# Patient Record
Sex: Female | Born: 1975 | Race: White | Hispanic: No | Smoking: Current every day smoker
Health system: Southern US, Community
[De-identification: ages and names within clinical notes are randomized; demographics above are authoritative.]

## PROBLEM LIST (undated history)

## (undated) DIAGNOSIS — D367 Benign neoplasm of other specified sites: Secondary | ICD-10-CM

## (undated) DIAGNOSIS — Z21 Asymptomatic human immunodeficiency virus [HIV] infection status: Secondary | ICD-10-CM

## (undated) DIAGNOSIS — L0390 Cellulitis, unspecified: Secondary | ICD-10-CM

## (undated) DIAGNOSIS — N926 Irregular menstruation, unspecified: Secondary | ICD-10-CM

## (undated) DIAGNOSIS — J40 Bronchitis, not specified as acute or chronic: Secondary | ICD-10-CM

## (undated) DIAGNOSIS — B20 Human immunodeficiency virus [HIV] disease: Secondary | ICD-10-CM

## (undated) DIAGNOSIS — G43909 Migraine, unspecified, not intractable, without status migrainosus: Secondary | ICD-10-CM

## (undated) DIAGNOSIS — D696 Thrombocytopenia, unspecified: Secondary | ICD-10-CM

## (undated) DIAGNOSIS — F329 Major depressive disorder, single episode, unspecified: Secondary | ICD-10-CM

## (undated) DIAGNOSIS — F32A Depression, unspecified: Secondary | ICD-10-CM

## (undated) DIAGNOSIS — D219 Benign neoplasm of connective and other soft tissue, unspecified: Secondary | ICD-10-CM

## (undated) HISTORY — DX: Bronchitis, not specified as acute or chronic: J40

## (undated) HISTORY — DX: Irregular menstruation, unspecified: N92.6

## (undated) HISTORY — DX: Migraine, unspecified, not intractable, without status migrainosus: G43.909

## (undated) HISTORY — DX: Cellulitis, unspecified: L03.90

## (undated) HISTORY — DX: Thrombocytopenia, unspecified: D69.6

## (undated) HISTORY — DX: Benign neoplasm of connective and other soft tissue, unspecified: D21.9

## (undated) HISTORY — PX: TUBAL LIGATION: SHX77

## (undated) HISTORY — DX: Human immunodeficiency virus (HIV) disease: B20

## (undated) HISTORY — DX: Benign neoplasm of other specified sites: D36.7

---

## 2011-07-18 ENCOUNTER — Emergency Department (HOSPITAL_COMMUNITY)
Admission: EM | Admit: 2011-07-18 | Discharge: 2011-07-19 | Disposition: A | Discharge status: Home / Self Care | Payer: Self-pay | Attending: Emergency Medicine | Admitting: Emergency Medicine

## 2011-07-18 ENCOUNTER — Emergency Department (HOSPITAL_COMMUNITY): Payer: Self-pay

## 2011-07-18 ENCOUNTER — Emergency Department (HOSPITAL_COMMUNITY)
Admission: EM | Admit: 2011-07-18 | Discharge: 2011-07-18 | Discharge status: Against Medical Advice | Payer: Self-pay | Attending: Emergency Medicine | Admitting: Emergency Medicine

## 2011-07-18 DIAGNOSIS — Z79899 Other long term (current) drug therapy: Secondary | ICD-10-CM | POA: Insufficient documentation

## 2011-07-18 DIAGNOSIS — J329 Chronic sinusitis, unspecified: Secondary | ICD-10-CM | POA: Insufficient documentation

## 2011-07-18 DIAGNOSIS — R079 Chest pain, unspecified: Secondary | ICD-10-CM | POA: Insufficient documentation

## 2011-07-18 DIAGNOSIS — R05 Cough: Secondary | ICD-10-CM | POA: Insufficient documentation

## 2011-07-18 DIAGNOSIS — J3489 Other specified disorders of nose and nasal sinuses: Secondary | ICD-10-CM | POA: Insufficient documentation

## 2011-07-18 DIAGNOSIS — R059 Cough, unspecified: Secondary | ICD-10-CM | POA: Insufficient documentation

## 2011-07-18 DIAGNOSIS — B20 Human immunodeficiency virus [HIV] disease: Secondary | ICD-10-CM | POA: Insufficient documentation

## 2011-07-18 DIAGNOSIS — Z0389 Encounter for observation for other suspected diseases and conditions ruled out: Secondary | ICD-10-CM | POA: Insufficient documentation

## 2011-07-18 DIAGNOSIS — J069 Acute upper respiratory infection, unspecified: Secondary | ICD-10-CM | POA: Insufficient documentation

## 2011-07-18 LAB — DIFFERENTIAL
Basophils Absolute: 0 10*3/uL (ref 0.0–0.1)
Lymphocytes Relative: 35 % (ref 12–46)
Neutro Abs: 3 10*3/uL (ref 1.7–7.7)
Neutrophils Relative %: 54 % (ref 43–77)

## 2011-07-18 LAB — LACTATE DEHYDROGENASE: LDH: 196 U/L (ref 94–250)

## 2011-07-18 LAB — COMPREHENSIVE METABOLIC PANEL
ALT: 19 U/L (ref 0–35)
Albumin: 4.4 g/dL (ref 3.5–5.2)
Alkaline Phosphatase: 54 U/L (ref 39–117)
BUN: 15 mg/dL (ref 6–23)
Chloride: 106 mEq/L (ref 96–112)
Glucose, Bld: 79 mg/dL (ref 70–99)
Potassium: 3.9 mEq/L (ref 3.5–5.1)
Total Bilirubin: 0.4 mg/dL (ref 0.3–1.2)

## 2011-07-18 LAB — CBC
HCT: 45.3 % (ref 36.0–46.0)
Hemoglobin: 15.5 g/dL — ABNORMAL HIGH (ref 12.0–15.0)
WBC: 5.6 10*3/uL (ref 4.0–10.5)

## 2011-09-07 ENCOUNTER — Ambulatory Visit: Payer: Self-pay

## 2011-09-13 ENCOUNTER — Telehealth: Payer: Self-pay

## 2011-09-13 DIAGNOSIS — B20 Human immunodeficiency virus [HIV] disease: Secondary | ICD-10-CM

## 2011-09-13 NOTE — Telephone Encounter (Signed)
Multiple attempts were made to reach patient via phone. She cancelled the scheduled intake appointment the day prior and then called back demanding to be seen ASAP.  She left a message on the voice mail for the receptionist stating she was not happy with the treatment or lack of care.  She requested a call back on Friday and if the call was not returned she was going to report our office to the superiors at the hospital.   She says her life is at stake and we don't seem to care.   Pt was informed she would need intake which she refused since she has been diagnosed with "HIV for years".  She is out of medication as has several health issues that must be addressed soon. She was advised the visit for labs will only be labs and she will not see the physician for 2 weeks . If she has an urgent need that needs attention she can go to the ED or the urgent care.  Pt was very rude and interrupted any comment I started.  She is a [facility] referral. Amy had problems obtaining the medical records which resulted in the delay of referral for the intake.   Labs will be ordered w/o intake and appointment with provider will be scheduled to accommodate the patient.  Laurell Josephs, RN   Appointment scheduled with Dr Ninetta Lights 09-21-16-12.   Laurell Josephs, RN

## 2011-09-14 ENCOUNTER — Other Ambulatory Visit: Payer: Self-pay

## 2011-09-15 ENCOUNTER — Other Ambulatory Visit: Payer: Self-pay | Admitting: Infectious Diseases

## 2011-09-15 ENCOUNTER — Telehealth: Payer: Self-pay

## 2011-09-15 ENCOUNTER — Ambulatory Visit: Payer: Self-pay

## 2011-09-15 ENCOUNTER — Other Ambulatory Visit: Payer: Self-pay

## 2011-09-15 DIAGNOSIS — B20 Human immunodeficiency virus [HIV] disease: Secondary | ICD-10-CM

## 2011-09-15 DIAGNOSIS — Z79899 Other long term (current) drug therapy: Secondary | ICD-10-CM | POA: Insufficient documentation

## 2011-09-15 DIAGNOSIS — Z113 Encounter for screening for infections with a predominantly sexual mode of transmission: Secondary | ICD-10-CM

## 2011-09-15 LAB — COMPLETE METABOLIC PANEL WITH GFR
ALT: 16 U/L (ref 0–35)
AST: 20 U/L (ref 0–37)
Albumin: 4.6 g/dL (ref 3.5–5.2)
Alkaline Phosphatase: 50 U/L (ref 39–117)
Calcium: 9.3 mg/dL (ref 8.4–10.5)
Chloride: 108 mEq/L (ref 96–112)
Potassium: 4.1 mEq/L (ref 3.5–5.3)

## 2011-09-15 LAB — CBC WITH DIFFERENTIAL/PLATELET
Basophils Absolute: 0 10*3/uL (ref 0.0–0.1)
Basophils Relative: 1 % (ref 0–1)
Eosinophils Absolute: 0.2 10*3/uL (ref 0.0–0.7)
Eosinophils Relative: 5 % (ref 0–5)
HCT: 47.1 % — ABNORMAL HIGH (ref 36.0–46.0)
MCH: 30.3 pg (ref 26.0–34.0)
MCHC: 33.5 g/dL (ref 30.0–36.0)
Monocytes Absolute: 0.2 10*3/uL (ref 0.1–1.0)
Neutro Abs: 2.4 10*3/uL (ref 1.7–7.7)
RDW: 14 % (ref 11.5–15.5)

## 2011-09-15 LAB — URINALYSIS, MICROSCOPIC ONLY

## 2011-09-15 LAB — URINALYSIS, ROUTINE W REFLEX MICROSCOPIC
Bilirubin Urine: NEGATIVE
Glucose, UA: NEGATIVE mg/dL
Ketones, ur: NEGATIVE mg/dL
Protein, ur: NEGATIVE mg/dL
Urobilinogen, UA: 0.2 mg/dL (ref 0.0–1.0)

## 2011-09-15 LAB — LIPID PANEL
Cholesterol: 196 mg/dL (ref 0–200)
HDL: 52 mg/dL (ref >39)
LDL Cholesterol: 120 mg/dL — ABNORMAL HIGH (ref 0–99)
Triglycerides: 118 mg/dL (ref <150)

## 2011-09-15 LAB — HEPATITIS C ANTIBODY: HCV Ab: NEGATIVE

## 2011-09-15 LAB — HEPATITIS B SURFACE ANTIGEN: Hepatitis B Surface Ag: NEGATIVE

## 2011-09-15 LAB — HIV ANTIBODY (ROUTINE TESTING W REFLEX): HIV: REACTIVE

## 2011-09-15 LAB — HEPATITIS B SURFACE ANTIBODY,QUALITATIVE: Hep B S Ab: NEGATIVE

## 2011-09-15 NOTE — Telephone Encounter (Signed)
Patient met with Asher Muir and me to complete ADAP application. Had received documentation from Amy Faw, but patient only provided bank stmts as income documentation and ADAP no longer accepts that. Advised there was chance they would pend her application. She and her 2 sons receive SSD benefits - advised her to bring letters and more current utility bill. She said she had at home and would bring back today. So far, she has not come in or called.

## 2011-09-16 LAB — T-HELPER CELL (CD4) - (RCID CLINIC ONLY)
CD4 % Helper T Cell: 40 % (ref 33–55)
CD4 T Cell Abs: 430 uL (ref 400–2700)

## 2011-09-16 LAB — HEPATITIS A ANTIBODY, TOTAL: Hep A Total Ab: POSITIVE — AB

## 2011-09-17 ENCOUNTER — Telehealth: Payer: Self-pay

## 2011-09-17 NOTE — Telephone Encounter (Signed)
Called patient today as she had not come in yet to bring her SSI letters to complete her ADAP application. She stated she would bring in today before we closed at 5PM. Stated she had to take son to Dr etc, and that is why she wasn't able to come before now.

## 2011-09-19 ENCOUNTER — Emergency Department (HOSPITAL_COMMUNITY)
Admission: EM | Admit: 2011-09-19 | Discharge: 2011-09-19 | Disposition: A | Discharge status: Home / Self Care | Payer: Self-pay | Attending: Emergency Medicine | Admitting: Emergency Medicine

## 2011-09-19 ENCOUNTER — Emergency Department (HOSPITAL_COMMUNITY): Payer: Self-pay

## 2011-09-19 ENCOUNTER — Encounter: Payer: Self-pay | Admitting: *Deleted

## 2011-09-19 DIAGNOSIS — B9689 Other specified bacterial agents as the cause of diseases classified elsewhere: Secondary | ICD-10-CM | POA: Insufficient documentation

## 2011-09-19 DIAGNOSIS — A499 Bacterial infection, unspecified: Secondary | ICD-10-CM | POA: Insufficient documentation

## 2011-09-19 DIAGNOSIS — D250 Submucous leiomyoma of uterus: Secondary | ICD-10-CM | POA: Insufficient documentation

## 2011-09-19 DIAGNOSIS — N760 Acute vaginitis: Secondary | ICD-10-CM | POA: Insufficient documentation

## 2011-09-19 DIAGNOSIS — R109 Unspecified abdominal pain: Secondary | ICD-10-CM | POA: Insufficient documentation

## 2011-09-19 HISTORY — DX: Human immunodeficiency virus (HIV) disease: B20

## 2011-09-19 HISTORY — DX: Asymptomatic human immunodeficiency virus (hiv) infection status: Z21

## 2011-09-19 LAB — URINALYSIS, ROUTINE W REFLEX MICROSCOPIC
Bilirubin Urine: NEGATIVE
Glucose, UA: NEGATIVE mg/dL
Ketones, ur: NEGATIVE mg/dL
Specific Gravity, Urine: 1.026 (ref 1.005–1.030)
pH: 7 (ref 5.0–8.0)

## 2011-09-19 LAB — WET PREP, GENITAL
Trich, Wet Prep: NONE SEEN
Yeast Wet Prep HPF POC: NONE SEEN

## 2011-09-19 MED ORDER — METRONIDAZOLE 500 MG PO TABS: 500.0000 mg | ORAL_TABLET | Freq: Two times a day (BID) | ORAL | Status: DC

## 2011-09-19 MED ORDER — HYDROMORPHONE HCL PF 1 MG/ML IJ SOLN
1.0000 mg | Freq: Once | INTRAMUSCULAR | Status: AC
  Administered 2011-09-19: 1 mg via INTRAMUSCULAR
  Filled 2011-09-19: qty 1

## 2011-09-19 MED ORDER — OXYCODONE-ACETAMINOPHEN 5-325 MG PO TABS
2.0000 | ORAL_TABLET | Freq: Once | ORAL | Status: AC
  Administered 2011-09-19: 2 via ORAL
  Filled 2011-09-19: qty 2

## 2011-09-19 NOTE — ED Notes (Signed)
Pt reports irregular periods x 6 months, right lower abd pain, vaginal discharge intermittent, urinary incontinence, denies pain with urination.

## 2011-09-19 NOTE — ED Provider Notes (Signed)
  Physical Exam  BP 122/78  Pulse 92  Temp(Src) 102.1 F (38.9 C) (Oral)  Resp 20  SpO2 98%  LMP 09/07/2011  Physical Exam  ED Course  Procedures  MDM Pt seen earlier today by another provider. Pt called ED requesting prescription for pain medication. Per review of narcotic database, pt was dispensed #75 percocet on 09/03/11 and has one refill of the same. No new additional prescriptions for pain medication were given.      Raeford Razor, MD 09/19/11 [phone]

## 2011-09-19 NOTE — ED Notes (Signed)
Pt to the restroom for urine sample.

## 2011-09-20 ENCOUNTER — Telehealth: Payer: Self-pay | Admitting: *Deleted

## 2011-09-20 DIAGNOSIS — B20 Human immunodeficiency virus [HIV] disease: Secondary | ICD-10-CM

## 2011-09-20 LAB — HIV-1 RNA ULTRAQUANT REFLEX TO GENTYP+
HIV 1 RNA Quant: <20 copies/mL (ref <20)
HIV-1 RNA Quant, Log: <1.3 {Log} (ref <1.30)

## 2011-09-20 MED ORDER — RALTEGRAVIR POTASSIUM 400 MG PO TABS: 400.0000 mg | ORAL_TABLET | Freq: Two times a day (BID) | ORAL | Status: DC

## 2011-09-20 MED ORDER — EMTRICITABINE-TENOFOVIR DF 200-300 MG PO TABS: 1.0000 | ORAL_TABLET | Freq: Every day | ORAL | Status: DC

## 2011-09-20 NOTE — Telephone Encounter (Signed)
Pt concerned about f/u from ED visit.  Was told to contact the [facility] Health Dept so that her partner could be treated if necessary for genital bacterial infection.  She was also told "she would need a hysterectomy d/t uterine fibroids" and should make an appt at [facility].  RN reviewed the ED notes for information.  Pt was diagnosed with BV and rx sent to pharmacy for treatment.  Pt did have a uterine ultrasound and showed a 1.9cm anterior fibroid.  The pt stated that she had been told at her previous HIV clinic in [place], [place] that she should schedule an ultrasound but had not had the time prior to moving to [place].  RN advised that the fibroid found on the ultrasound was less than an inch in diameter and was described as "small anterior fibroid."  RN apologized that the pt was told that she "would need a hysterectomy" for this issue.  RN advised that the pt should talk with Dr. Daiva Eves about these issues at her first visit to [facility] on  09/27/11 @ 1130.  Pt verbalized understanding of this advise.

## 2011-09-27 ENCOUNTER — Ambulatory Visit (HOSPITAL_COMMUNITY): Payer: Self-pay

## 2011-09-27 ENCOUNTER — Ambulatory Visit: Payer: Self-pay

## 2011-09-27 ENCOUNTER — Ambulatory Visit (INDEPENDENT_AMBULATORY_CARE_PROVIDER_SITE_OTHER): Payer: Self-pay | Admitting: Infectious Disease

## 2011-09-27 ENCOUNTER — Other Ambulatory Visit (HOSPITAL_COMMUNITY): Payer: Self-pay | Admitting: *Deleted

## 2011-09-27 ENCOUNTER — Encounter: Payer: Self-pay | Admitting: Infectious Disease

## 2011-09-27 VITALS — BP 123/81 | HR 96 | Temp 98.1°F | Ht 61.0 in | Wt 111.2 lb

## 2011-09-27 DIAGNOSIS — G43909 Migraine, unspecified, not intractable, without status migrainosus: Secondary | ICD-10-CM

## 2011-09-27 DIAGNOSIS — D367 Benign neoplasm of other specified sites: Secondary | ICD-10-CM

## 2011-09-27 DIAGNOSIS — Z23 Encounter for immunization: Secondary | ICD-10-CM

## 2011-09-27 DIAGNOSIS — B20 Human immunodeficiency virus [HIV] disease: Secondary | ICD-10-CM

## 2011-09-27 DIAGNOSIS — J45909 Unspecified asthma, uncomplicated: Secondary | ICD-10-CM | POA: Insufficient documentation

## 2011-09-27 DIAGNOSIS — N926 Irregular menstruation, unspecified: Secondary | ICD-10-CM | POA: Insufficient documentation

## 2011-09-27 DIAGNOSIS — D2360 Other benign neoplasm of skin of unspecified upper limb, including shoulder: Secondary | ICD-10-CM

## 2011-09-27 DIAGNOSIS — F329 Major depressive disorder, single episode, unspecified: Secondary | ICD-10-CM | POA: Insufficient documentation

## 2011-09-27 DIAGNOSIS — F4310 Post-traumatic stress disorder, unspecified: Secondary | ICD-10-CM | POA: Insufficient documentation

## 2011-09-27 DIAGNOSIS — J40 Bronchitis, not specified as acute or chronic: Secondary | ICD-10-CM

## 2011-09-27 MED ORDER — DOXYCYCLINE HYCLATE 100 MG PO TABS: 100.0000 mg | ORAL_TABLET | Freq: Two times a day (BID) | ORAL | Status: AC

## 2011-09-27 NOTE — Assessment & Plan Note (Signed)
We'll check a Doppler to ensure there is no DVT although suspicion is not terribly high. We'll give her doxycycline for her bronchitis also potentially cover a cellulitis in this area.

## 2011-09-27 NOTE — Assessment & Plan Note (Signed)
Continue isentress and truvada. Influenza vaccine today. Bring back in 2 months time.

## 2011-09-27 NOTE — Assessment & Plan Note (Signed)
Continue Celexa refer to psychiatry

## 2011-09-27 NOTE — Assessment & Plan Note (Signed)
Is a 14 day course of doxycycline. Continue inhalers.
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
## 2011-09-27 NOTE — Assessment & Plan Note (Signed)
Continue Topamax and Imitrex for breakthrough migraines.

## 2011-09-27 NOTE — Assessment & Plan Note (Signed)
Refer to psychiatry. 

## 2011-09-28 DIAGNOSIS — Z23 Encounter for immunization: Secondary | ICD-10-CM

## 2011-10-01 ENCOUNTER — Ambulatory Visit: Payer: Self-pay | Admitting: Infectious Diseases

## 2011-11-22 ENCOUNTER — Other Ambulatory Visit: Payer: Self-pay | Admitting: Licensed Clinical Social Worker

## 2011-11-22 ENCOUNTER — Other Ambulatory Visit: Payer: Self-pay | Admitting: *Deleted

## 2011-11-22 DIAGNOSIS — B20 Human immunodeficiency virus [HIV] disease: Secondary | ICD-10-CM

## 2011-11-22 MED ORDER — RALTEGRAVIR POTASSIUM 400 MG PO TABS: 400.0000 mg | ORAL_TABLET | Freq: Two times a day (BID) | ORAL | Status: DC

## 2011-11-22 MED ORDER — EMTRICITABINE-TENOFOVIR DF 200-300 MG PO TABS: 1.0000 | ORAL_TABLET | Freq: Every day | ORAL | Status: DC

## 2011-11-22 NOTE — Telephone Encounter (Signed)
Rxes already refill.

## 2011-11-24 ENCOUNTER — Other Ambulatory Visit (INDEPENDENT_AMBULATORY_CARE_PROVIDER_SITE_OTHER): Payer: Self-pay

## 2011-11-24 ENCOUNTER — Telehealth: Payer: Self-pay | Admitting: *Deleted

## 2011-11-24 ENCOUNTER — Other Ambulatory Visit: Payer: Self-pay | Admitting: Infectious Disease

## 2011-11-24 DIAGNOSIS — Z113 Encounter for screening for infections with a predominantly sexual mode of transmission: Secondary | ICD-10-CM

## 2011-11-24 DIAGNOSIS — Z79899 Other long term (current) drug therapy: Secondary | ICD-10-CM

## 2011-11-24 DIAGNOSIS — B20 Human immunodeficiency virus [HIV] disease: Secondary | ICD-10-CM

## 2011-11-24 NOTE — Telephone Encounter (Signed)
Having asthma symptoms of SOB/wheezing.  Previous rx from [facility] has run out of refills.  Requesting refill for Albuterol.  Please advise.

## 2011-11-24 NOTE — Telephone Encounter (Signed)
Fine togive but she should also get seen if she is wheezing badly. May need steroid pack

## 2011-11-25 LAB — CBC WITH DIFFERENTIAL/PLATELET
Basophils Absolute: 0 10*3/uL (ref 0.0–0.1)
Eosinophils Relative: 7 % — ABNORMAL HIGH (ref 0–5)
HCT: 46.5 % — ABNORMAL HIGH (ref 36.0–46.0)
Lymphocytes Relative: 36 % (ref 12–46)
Lymphs Abs: 1.3 10*3/uL (ref 0.7–4.0)
MCV: 91.9 fL (ref 78.0–100.0)
Monocytes Absolute: 0.2 10*3/uL (ref 0.1–1.0)
Neutro Abs: 1.9 10*3/uL (ref 1.7–7.7)
Platelets: 215 10*3/uL (ref 150–400)
RBC: 5.06 MIL/uL (ref 3.87–5.11)
WBC: 3.7 10*3/uL — ABNORMAL LOW (ref 4.0–10.5)

## 2011-11-25 LAB — LIPID PANEL
HDL: 45 mg/dL (ref >39)
Total CHOL/HDL Ratio: 3.8 Ratio
Triglycerides: 182 mg/dL — ABNORMAL HIGH (ref <150)

## 2011-11-25 LAB — GC/CHLAMYDIA PROBE AMP, URINE: Chlamydia, Swab/Urine, PCR: NEGATIVE

## 2011-11-25 LAB — T-HELPER CELL (CD4) - (RCID CLINIC ONLY): CD4 % Helper T Cell: 39 % (ref 33–55)

## 2011-11-26 LAB — HIV-1 RNA QUANT-NO REFLEX-BLD: HIV-1 RNA Quant, Log: <1.3 {Log} (ref <1.30)

## 2011-11-29 ENCOUNTER — Other Ambulatory Visit: Payer: Self-pay | Admitting: Infectious Disease

## 2011-11-29 MED ORDER — ALBUTEROL SULFATE HFA 108 (90 BASE) MCG/ACT IN AERS: 2.0000 | INHALATION_SPRAY | Freq: Four times a day (QID) | RESPIRATORY_TRACT | Status: DC | PRN

## 2011-11-29 NOTE — Telephone Encounter (Signed)
FINE TO GIVE THIS TO HIM. HE MAY NEED TO BE SEEN BY SOMEONE

## 2011-12-01 ENCOUNTER — Other Ambulatory Visit: Payer: Self-pay | Admitting: *Deleted

## 2011-12-01 MED ORDER — ALBUTEROL SULFATE HFA 108 (90 BASE) MCG/ACT IN AERS: 2.0000 | INHALATION_SPRAY | Freq: Four times a day (QID) | RESPIRATORY_TRACT | Status: AC | PRN

## 2011-12-01 NOTE — Telephone Encounter (Signed)
Spoke with pt.  She found her prior rx number and called for a refill.  Breathing has improved.  New rx will be called to [facility] on [address] for future use.

## 2011-12-08 ENCOUNTER — Ambulatory Visit (INDEPENDENT_AMBULATORY_CARE_PROVIDER_SITE_OTHER): Payer: Self-pay | Admitting: Infectious Disease

## 2011-12-08 ENCOUNTER — Encounter: Payer: Self-pay | Admitting: Infectious Disease

## 2011-12-08 ENCOUNTER — Ambulatory Visit: Payer: Self-pay

## 2011-12-08 VITALS — BP 104/70 | HR 71 | Temp 98.0°F | Wt 117.0 lb

## 2011-12-08 DIAGNOSIS — F329 Major depressive disorder, single episode, unspecified: Secondary | ICD-10-CM

## 2011-12-08 DIAGNOSIS — Z21 Asymptomatic human immunodeficiency virus [HIV] infection status: Secondary | ICD-10-CM

## 2011-12-08 DIAGNOSIS — B20 Human immunodeficiency virus [HIV] disease: Secondary | ICD-10-CM | POA: Insufficient documentation

## 2011-12-08 DIAGNOSIS — F4310 Post-traumatic stress disorder, unspecified: Secondary | ICD-10-CM

## 2011-12-08 MED ORDER — CITALOPRAM HYDROBROMIDE 20 MG PO TABS: 20.0000 mg | ORAL_TABLET | Freq: Every day | ORAL | Status: DC

## 2011-12-08 MED ORDER — CLONAZEPAM 0.5 MG PO TABS: 0.5000 mg | ORAL_TABLET | Freq: Every day | ORAL | Status: DC

## 2011-12-08 NOTE — Assessment & Plan Note (Signed)
Restart celexa and klonopin and take every day. Plugged into [facility] for Counselling and CBT

## 2011-12-08 NOTE — Progress Notes (Signed)
Subjective:    Patient ID: Cassandra Rogers, female    DOB: 1976-10-17, 36 y.o.   MRN: [id number]  HPI 36 year old Caucasian lady with HIV diagnosed at the age of 16 years, managed previously in [facility]. She moved to [place] end of July/August. She has been on isentress and truvada with excellent virological control. She presents today to [facility] for followup. Her HIV remains perfectly controlled. She  Is having less migraines and is off of the topomax now. Her depression and PTSD however are not doing well. She is being seen by [facility] but she is off ssri and benzos and having panic attacks. At times she is having passive suicidal ideation but not active suicidal ideation and she is contracted for safety. I spent greater than 45 minutes with the patient including greater than 50% of time in face to face counsel of the patient and in coordination of their care.   [facility] is giving her CBT and Biofeedback.   Review of Systems  Constitutional: Negative for fever, chills, diaphoresis, activity change, appetite change, fatigue and unexpected weight change.  HENT: Negative for congestion, sore throat, rhinorrhea, sneezing, trouble swallowing and sinus pressure.   Eyes: Negative for photophobia and visual disturbance.  Respiratory: Negative for cough, chest tightness, shortness of breath, wheezing and stridor.   Cardiovascular: Negative for chest pain, palpitations and leg swelling.  Gastrointestinal: Negative for nausea, vomiting, abdominal pain, diarrhea, constipation, blood in stool, abdominal distention and anal bleeding.  Genitourinary: Negative for dysuria, hematuria, flank pain and difficulty urinating.  Musculoskeletal: Negative for myalgias, back pain, joint swelling, arthralgias and gait problem.  Skin: Negative for color change, pallor, rash and wound.  Neurological: Negative for dizziness, tremors, weakness and light-headedness.  Hematological: Negative  for adenopathy. Does not bruise/bleed easily.  Psychiatric/Behavioral: Positive for suicidal ideas, dysphoric mood and decreased concentration. Negative for behavioral problems, confusion, sleep disturbance and agitation. The patient is nervous/anxious.        Objective:   Physical Exam  Constitutional: She is oriented to person, place, and time. She appears well-developed and well-nourished. No distress.  HENT:  Head: Normocephalic and atraumatic.  Mouth/Throat: Oropharynx is clear and moist. No oropharyngeal exudate.  Eyes: Conjunctivae and EOM are normal. Pupils are equal, round, and reactive to light. No scleral icterus.  Neck: Normal range of motion. Neck supple. No JVD present.  Cardiovascular: Normal rate, regular rhythm and normal heart sounds.  Exam reveals no gallop and no friction rub.   No murmur heard. Pulmonary/Chest: Effort normal and breath sounds normal. No respiratory distress. She has no wheezes. She has no rales. She exhibits no tenderness.  Abdominal: She exhibits no distension and no mass. There is no tenderness. There is no rebound and no guarding.  Musculoskeletal: She exhibits no edema and no tenderness.  Lymphadenopathy:    She has no cervical adenopathy.  Neurological: She is alert and oriented to person, place, and time. She has normal reflexes. She exhibits normal muscle tone. Coordination normal.  Skin: Skin is warm and dry. She is not diaphoretic. No erythema. No pallor.  Psychiatric: Her behavior is normal. Judgment and thought content normal. Her mood appears anxious. She exhibits a depressed mood.          Assessment & Plan:  HIV (human immunodeficiency virus infection) Perfect suppression  PTSD (post-traumatic stress disorder) Restart celexa and klonopin and take every day. Plugged into [facility] for Counselling and CBT  Depression See above discussion

## 2011-12-08 NOTE — Assessment & Plan Note (Signed)
Perfect suppression 

## 2011-12-08 NOTE — Patient Instructions (Signed)
I would like you to meet with [facility] case manager today and refer you to psychiatry to complement the other care you are receiving

## 2011-12-08 NOTE — Assessment & Plan Note (Signed)
See above discussion

## 2012-01-25 ENCOUNTER — Encounter (HOSPITAL_COMMUNITY): Payer: Self-pay | Admitting: Emergency Medicine

## 2012-01-25 ENCOUNTER — Emergency Department (HOSPITAL_COMMUNITY)
Admission: EM | Admit: 2012-01-25 | Discharge: 2012-01-25 | Disposition: A | Discharge status: Home Health | Payer: Self-pay | Attending: Emergency Medicine | Admitting: Emergency Medicine

## 2012-01-25 ENCOUNTER — Emergency Department (HOSPITAL_COMMUNITY): Payer: Self-pay

## 2012-01-25 DIAGNOSIS — S91309A Unspecified open wound, unspecified foot, initial encounter: Secondary | ICD-10-CM | POA: Insufficient documentation

## 2012-01-25 DIAGNOSIS — B20 Human immunodeficiency virus [HIV] disease: Secondary | ICD-10-CM | POA: Insufficient documentation

## 2012-01-25 DIAGNOSIS — F17200 Nicotine dependence, unspecified, uncomplicated: Secondary | ICD-10-CM | POA: Insufficient documentation

## 2012-01-25 DIAGNOSIS — G43909 Migraine, unspecified, not intractable, without status migrainosus: Secondary | ICD-10-CM | POA: Insufficient documentation

## 2012-01-25 DIAGNOSIS — W268XXA Contact with other sharp object(s), not elsewhere classified, initial encounter: Secondary | ICD-10-CM | POA: Insufficient documentation

## 2012-01-25 DIAGNOSIS — J45909 Unspecified asthma, uncomplicated: Secondary | ICD-10-CM | POA: Insufficient documentation

## 2012-01-25 DIAGNOSIS — Y998 Other external cause status: Secondary | ICD-10-CM | POA: Insufficient documentation

## 2012-01-25 DIAGNOSIS — S91319A Laceration without foreign body, unspecified foot, initial encounter: Secondary | ICD-10-CM

## 2012-01-25 DIAGNOSIS — Y9389 Activity, other specified: Secondary | ICD-10-CM | POA: Insufficient documentation
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
## 2012-01-25 NOTE — ED Notes (Signed)
Patient transported to X-ray 
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
## 2012-01-25 NOTE — Discharge Instructions (Signed)
Your x-rays today do not show any signs for large piece of glass or other foreign body. Your providers also cannot find any large pieces of glass while they explored your wound. There is always possibility for very small pieces of foreign body or glass. It should not cause any problems in the healing process. Your providers place 2 sutures to help with healing and minimize scarring. These will need to be removed in 7-10 days or health care provider. You may followup with primary care provider or return to the emergency room. If you develop any redness, swelling, increased bleeding or discharge from your wound these may be signs of infection and you should return to the emergency room.   Laceration Care, Adult A laceration is a cut or lesion that goes through all layers of the skin and into the tissue just beneath the skin. TREATMENT  Some lacerations may not require closure. Some lacerations may not be able to be closed due to an increased risk of infection. It is important to see your caregiver as soon as possible after an injury to minimize the risk of infection and maximize the opportunity for successful closure. If closure is appropriate, pain medicines may be given, if needed. The wound will be cleaned to help prevent infection. Your caregiver will use stitches (sutures), staples, wound glue (adhesive), or skin adhesive strips to repair the laceration. These tools bring the skin edges together to allow for faster healing and a better cosmetic outcome. However, all wounds will heal with a scar. Once the wound has healed, scarring can be minimized by covering the wound with sunscreen during the day for 1 full year. HOME CARE INSTRUCTIONS  For sutures or staples:  Keep the wound clean and dry.   If you were given a bandage (dressing), you should change it at least once a day. Also, change the dressing if it becomes wet or dirty, or as directed by your caregiver.   Wash the wound with soap and water  2 times a day. Rinse the wound off with water to remove all soap. Pat the wound dry with a clean towel.   After cleaning, apply a thin layer of the antibiotic ointment as recommended by your caregiver. This will help prevent infection and keep the dressing from sticking.   You may shower as usual after the first 24 hours. Do not soak the wound in water until the sutures are removed.   Only take over-the-counter or prescription medicines for pain, discomfort, or fever as directed by your caregiver.   Get your sutures or staples removed as directed by your caregiver.  For skin adhesive strips:  Keep the wound clean and dry.   Do not get the skin adhesive strips wet. You may bathe carefully, using caution to keep the wound dry.   If the wound gets wet, pat it dry with a clean towel.   Skin adhesive strips will fall off on their own. You may trim the strips as the wound heals. Do not remove skin adhesive strips that are still stuck to the wound. They will fall off in time.  For wound adhesive:  You may briefly wet your wound in the shower or bath. Do not soak or scrub the wound. Do not swim. Avoid periods of heavy perspiration until the skin adhesive has fallen off on its own. After showering or bathing, gently pat the wound dry with a clean towel.   Do not apply liquid medicine, cream medicine, or ointment medicine  to your wound while the skin adhesive is in place. This may loosen the film before your wound is healed.   If a dressing is placed over the wound, be careful not to apply tape directly over the skin adhesive. This may cause the adhesive to be pulled off before the wound is healed.   Avoid prolonged exposure to sunlight or tanning lamps while the skin adhesive is in place. Exposure to ultraviolet light in the first year will darken the scar.   The skin adhesive will usually remain in place for 5 to 10 days, then naturally fall off the skin. Do not pick at the adhesive film.  You  may need a tetanus shot if:  You cannot remember when you had your last tetanus shot.   You have never had a tetanus shot.  If you get a tetanus shot, your arm may swell, get red, and feel warm to the touch. This is common and not a problem. If you need a tetanus shot and you choose not to have one, there is a rare chance of getting tetanus. Sickness from tetanus can be serious. SEEK MEDICAL CARE IF:   You have redness, swelling, or increasing pain in the wound.   You see a red line that goes away from the wound.   You have yellowish-white fluid (pus) coming from the wound.   You have a fever.   You notice a bad smell coming from the wound or dressing.   Your wound breaks open before or after sutures have been removed.   You notice something coming out of the wound such as wood or glass.   Your wound is on your hand or foot and you cannot move a finger or toe.  SEEK IMMEDIATE MEDICAL CARE IF:   Your pain is not controlled with prescribed medicine.   You have severe swelling around the wound causing pain and numbness or a change in color in your arm, hand, leg, or foot.   Your wound splits open and starts bleeding.   You have worsening numbness, weakness, or loss of function of any joint around or beyond the wound.   You develop painful lumps near the wound or on the skin anywhere on your body.  MAKE SURE YOU:   Understand these instructions.   Will watch your condition.   Will get help right away if you are not doing well or get worse.  Document Released: 11/01/2005 Document Revised: 10/21/2011 Document Reviewed: 04/27/2011 [facility] Patient Information 2012 [place], [place].

## 2012-01-26 NOTE — ED Provider Notes (Signed)
Medical screening examination/treatment/procedure(s) were performed by non-physician practitioner and as supervising physician I was immediately available for consultation/collaboration.   Michelena Culmer A. Yi Haugan, MD 01/26/12 1630 

## 2012-02-09 ENCOUNTER — Ambulatory Visit (INDEPENDENT_AMBULATORY_CARE_PROVIDER_SITE_OTHER): Payer: Self-pay | Admitting: Infectious Disease

## 2012-02-09 ENCOUNTER — Encounter: Payer: Self-pay | Admitting: Infectious Disease

## 2012-02-09 VITALS — BP 110/69 | HR 68 | Temp 98.0°F | Ht 61.0 in | Wt 117.0 lb

## 2012-02-09 DIAGNOSIS — B354 Tinea corporis: Secondary | ICD-10-CM

## 2012-02-09 DIAGNOSIS — B369 Superficial mycosis, unspecified: Secondary | ICD-10-CM

## 2012-02-09 DIAGNOSIS — F329 Major depressive disorder, single episode, unspecified: Secondary | ICD-10-CM

## 2012-02-09 DIAGNOSIS — F4310 Post-traumatic stress disorder, unspecified: Secondary | ICD-10-CM

## 2012-02-09 DIAGNOSIS — Z113 Encounter for screening for infections with a predominantly sexual mode of transmission: Secondary | ICD-10-CM

## 2012-02-09 DIAGNOSIS — B20 Human immunodeficiency virus [HIV] disease: Secondary | ICD-10-CM

## 2012-02-09 DIAGNOSIS — Z21 Asymptomatic human immunodeficiency virus [HIV] infection status: Secondary | ICD-10-CM

## 2012-02-09 LAB — COMPLETE METABOLIC PANEL WITH GFR
Alkaline Phosphatase: 53 U/L (ref 39–117)
CO2: 27 mEq/L (ref 19–32)
Creat: 0.74 mg/dL (ref 0.50–1.10)
GFR, Est African American: >89 mL/min
GFR, Est Non African American: >89 mL/min
Glucose, Bld: 84 mg/dL (ref 70–99)
Total Bilirubin: 0.2 mg/dL — ABNORMAL LOW (ref 0.3–1.2)

## 2012-02-09 LAB — CBC WITH DIFFERENTIAL/PLATELET
Basophils Relative: 1 % (ref 0–1)
Hemoglobin: 14.4 g/dL (ref 12.0–15.0)
Lymphocytes Relative: 35 % (ref 12–46)
Lymphs Abs: 1.3 10*3/uL (ref 0.7–4.0)
Monocytes Relative: 9 % (ref 3–12)
Neutro Abs: 1.7 10*3/uL (ref 1.7–7.7)
Neutrophils Relative %: 46 % (ref 43–77)
RBC: 4.84 MIL/uL (ref 3.87–5.11)
WBC: 3.6 10*3/uL — ABNORMAL LOW (ref 4.0–10.5)

## 2012-02-09 MED ORDER — CITALOPRAM HYDROBROMIDE 40 MG PO TABS: 40.0000 mg | ORAL_TABLET | Freq: Every day | ORAL | Status: DC

## 2012-02-09 MED ORDER — FLUCONAZOLE 100 MG PO TABS: 100.0000 mg | ORAL_TABLET | Freq: Every day | ORAL | Status: AC

## 2012-02-09 NOTE — Progress Notes (Signed)
  Subjective:    Patient ID: Cassandra Rogers, female    DOB: [birth year], 36 y.o.   MRN: [id number]  HPI  36 year old Caucasian lady with HIV diagnosed at the age of 16 years, managed previously in [facility]. She moved to [place] end of July/August 2012. She has been on isentress and truvada with excellent virological control. She presents today to [facility] for followup. Her HIV remains perfectly controlled.  Her depression and PTSD however are better controlled. She never met with [facility] but intends to do so again. She is having a flare of apparent cutaneous fungal infection of scalp that is flaring. She requests fluconazole. She is otherwise doing well. I spent greater than 45 minutes with the patient including greater than 50% of time in face to face counsel of the patient and in coordination of their care.   Review of Systems  Constitutional: Negative for fever, chills, diaphoresis, activity change, appetite change, fatigue and unexpected weight change.  HENT: Negative for congestion, sore throat, rhinorrhea, sneezing, trouble swallowing and sinus pressure.   Eyes: Negative for photophobia and visual disturbance.  Respiratory: Negative for cough, chest tightness, shortness of breath, wheezing and stridor.   Cardiovascular: Negative for chest pain, palpitations and leg swelling.  Gastrointestinal: Negative for nausea, vomiting, abdominal pain, diarrhea, constipation, blood in stool, abdominal distention and anal bleeding.  Genitourinary: Negative for dysuria, hematuria, flank pain and difficulty urinating.  Musculoskeletal: Negative for myalgias, back pain, joint swelling, arthralgias and gait problem.  Skin: Positive for rash. Negative for color change, pallor and wound.  Neurological: Negative for dizziness, tremors, weakness and light-headedness.  Hematological: Negative for adenopathy. Does not bruise/bleed easily.  Psychiatric/Behavioral: Positive for dysphoric mood. Negative for suicidal  ideas, behavioral problems, confusion, sleep disturbance, self-injury, decreased concentration and agitation.       Objective:   Physical Exam  Constitutional: She is oriented to person, place, and time. She appears well-developed and well-nourished. No distress.  HENT:  Head: Normocephalic and atraumatic.    Mouth/Throat: Oropharynx is clear and moist. No oropharyngeal exudate.  Eyes: Conjunctivae and EOM are normal. Pupils are equal, round, and reactive to light. No scleral icterus.  Neck: Normal range of motion. Neck supple. No JVD present.  Cardiovascular: Normal rate, regular rhythm and normal heart sounds.  Exam reveals no gallop and no friction rub.   No murmur heard. Pulmonary/Chest: Effort normal and breath sounds normal. No respiratory distress. She has no wheezes. She has no rales. She exhibits no tenderness.  Abdominal: She exhibits no distension and no mass. There is no tenderness. There is no rebound and no guarding.  Musculoskeletal: She exhibits no edema and no tenderness.  Lymphadenopathy:    She has no cervical adenopathy.  Neurological: She is alert and oriented to person, place, and time. She has normal reflexes. She exhibits normal muscle tone. Coordination normal.  Skin: Skin is warm and dry. She is not diaphoretic. No erythema. No pallor.  Psychiatric: She has a normal mood and affect. Her behavior is normal. Judgment and thought content normal.          Assessment & Plan:  HIV (human immunodeficiency virus infection) Perfect control recheck labs today  PTSD (post-traumatic stress disorder) Up celexa meet with counselor  Fungal skin infection Diflucan and her topical nystatin. If worse can consider ketoconazoel shampoo and derm referral

## 2012-02-09 NOTE — Assessment & Plan Note (Signed)
Perfect control recheck labs today

## 2012-02-09 NOTE — Assessment & Plan Note (Signed)
Diflucan and her topical nystatin. If worse can consider ketoconazoel shampoo and derm referral

## 2012-02-09 NOTE — Assessment & Plan Note (Signed)
Up celexa meet with counselor

## 2012-02-10 LAB — T-HELPER CELL (CD4) - (RCID CLINIC ONLY)
CD4 % Helper T Cell: 44 % (ref 33–55)
CD4 T Cell Abs: 520 uL (ref 400–2700)

## 2012-02-11 LAB — HIV-1 RNA QUANT-NO REFLEX-BLD
HIV 1 RNA Quant: <20 copies/mL (ref <20)
HIV-1 RNA Quant, Log: <1.3 {Log} (ref <1.30)

## 2012-02-29 ENCOUNTER — Telehealth: Payer: Self-pay | Admitting: *Deleted

## 2012-02-29 NOTE — Telephone Encounter (Signed)
Message left to call [facility] to reschedule PAP smear appt.

## 2012-03-10 ENCOUNTER — Ambulatory Visit: Payer: Self-pay

## 2012-03-10 ENCOUNTER — Ambulatory Visit (INDEPENDENT_AMBULATORY_CARE_PROVIDER_SITE_OTHER): Payer: Self-pay | Admitting: *Deleted

## 2012-03-10 DIAGNOSIS — Z124 Encounter for screening for malignant neoplasm of cervix: Secondary | ICD-10-CM

## 2012-03-10 NOTE — Progress Notes (Signed)
  Subjective:     Cassandra Rogers is a 36 y.o. woman who comes in today for a  pap smear only. Previous abnormal Pap smears: yes. Contraception:  BTL, condoms  Objective:    There were no vitals taken for this visit. Pelvic Exam:Pap smear obtained.   Assessment:    Screening pap smear.   Plan:    Follow up in one year, or as indicated by Pap results.  Provided educational materials re: HIV and women, BSE, nutrition, diet, exercise, PAP smear, heart health and self-esteem.

## 2012-03-10 NOTE — Patient Instructions (Signed)
  Your results will be ready in about a week.  I will mail them to you.  Thank you for coming to the Center for your care.  Quetzally Callas 

## 2012-03-15 ENCOUNTER — Encounter: Payer: Self-pay | Admitting: *Deleted

## 2012-04-13 ENCOUNTER — Emergency Department (HOSPITAL_BASED_OUTPATIENT_CLINIC_OR_DEPARTMENT_OTHER)
Admission: EM | Admit: 2012-04-13 | Discharge: 2012-04-13 | Disposition: A | Discharge status: Home / Self Care | Payer: Medicaid Other | Attending: Emergency Medicine | Admitting: Emergency Medicine

## 2012-04-13 ENCOUNTER — Encounter (HOSPITAL_BASED_OUTPATIENT_CLINIC_OR_DEPARTMENT_OTHER): Payer: Self-pay | Admitting: Emergency Medicine

## 2012-04-13 ENCOUNTER — Emergency Department (HOSPITAL_BASED_OUTPATIENT_CLINIC_OR_DEPARTMENT_OTHER): Payer: Medicaid Other

## 2012-04-13 DIAGNOSIS — L0201 Cutaneous abscess of face: Secondary | ICD-10-CM | POA: Insufficient documentation

## 2012-04-13 DIAGNOSIS — T07XXXA Unspecified multiple injuries, initial encounter: Secondary | ICD-10-CM | POA: Insufficient documentation

## 2012-04-13 DIAGNOSIS — R404 Transient alteration of awareness: Secondary | ICD-10-CM | POA: Insufficient documentation

## 2012-04-13 DIAGNOSIS — IMO0002 Reserved for concepts with insufficient information to code with codable children: Secondary | ICD-10-CM | POA: Insufficient documentation

## 2012-04-13 DIAGNOSIS — X58XXXA Exposure to other specified factors, initial encounter: Secondary | ICD-10-CM | POA: Insufficient documentation

## 2012-04-13 DIAGNOSIS — Z79899 Other long term (current) drug therapy: Secondary | ICD-10-CM | POA: Insufficient documentation

## 2012-04-13 DIAGNOSIS — F411 Generalized anxiety disorder: Secondary | ICD-10-CM | POA: Insufficient documentation

## 2012-04-13 DIAGNOSIS — L0291 Cutaneous abscess, unspecified: Secondary | ICD-10-CM

## 2012-04-13 DIAGNOSIS — J45909 Unspecified asthma, uncomplicated: Secondary | ICD-10-CM | POA: Insufficient documentation

## 2012-04-13 DIAGNOSIS — L0390 Cellulitis, unspecified: Secondary | ICD-10-CM

## 2012-04-13 DIAGNOSIS — R4182 Altered mental status, unspecified: Secondary | ICD-10-CM | POA: Insufficient documentation

## 2012-04-13 DIAGNOSIS — B20 Human immunodeficiency virus [HIV] disease: Secondary | ICD-10-CM | POA: Insufficient documentation

## 2012-04-13 DIAGNOSIS — L03211 Cellulitis of face: Secondary | ICD-10-CM | POA: Insufficient documentation

## 2012-04-13 LAB — CBC
Hemoglobin: 14.7 g/dL (ref 12.0–15.0)
MCHC: 35.1 g/dL (ref 30.0–36.0)
Platelets: 224 10*3/uL (ref 150–400)

## 2012-04-13 LAB — PROTIME-INR: Prothrombin Time: 13.8 seconds (ref 11.6–15.2)

## 2012-04-13 LAB — URINALYSIS, ROUTINE W REFLEX MICROSCOPIC
Bilirubin Urine: NEGATIVE
Glucose, UA: NEGATIVE mg/dL
Hgb urine dipstick: NEGATIVE
Ketones, ur: 15 mg/dL — AB
Protein, ur: NEGATIVE mg/dL
pH: 5.5 (ref 5.0–8.0)

## 2012-04-13 LAB — COMPREHENSIVE METABOLIC PANEL
ALT: 30 U/L (ref 0–35)
AST: 35 U/L (ref 0–37)
Albumin: 4.3 g/dL (ref 3.5–5.2)
Alkaline Phosphatase: 51 U/L (ref 39–117)
BUN: 15 mg/dL (ref 6–23)
Chloride: 104 mEq/L (ref 96–112)
Potassium: 3.9 mEq/L (ref 3.5–5.1)
Sodium: 138 mEq/L (ref 135–145)
Total Bilirubin: 0.5 mg/dL (ref 0.3–1.2)
Total Protein: 6.9 g/dL (ref 6.0–8.3)

## 2012-04-13 LAB — RAPID URINE DRUG SCREEN, HOSP PERFORMED
Amphetamines: POSITIVE — AB
Barbiturates: NOT DETECTED
Benzodiazepines: NOT DETECTED
Cocaine: NOT DETECTED

## 2012-04-13 LAB — DIFFERENTIAL
Basophils Absolute: 0 10*3/uL (ref 0.0–0.1)
Basophils Relative: 0 % (ref 0–1)
Monocytes Relative: 9 % (ref 3–12)
Neutro Abs: 3.3 10*3/uL (ref 1.7–7.7)
Neutrophils Relative %: 60 % (ref 43–77)

## 2012-04-13 MED ORDER — CLINDAMYCIN HCL 150 MG PO CAPS: 150.0000 mg | ORAL_CAPSULE | Freq: Four times a day (QID) | ORAL | Status: AC

## 2012-04-13 NOTE — ED Provider Notes (Addendum)
History     CSN: [id number]  Arrival date & time 04/13/12  1757   First MD Initiated Contact with Patient 04/13/12 1812      Chief Complaint  Patient presents with  . Anxiety    (Consider location/radiation/quality/duration/timing/severity/associated sxs/prior treatment) HPI Comments: Patient was driving in her car today speaking with her family when she suddenly was unable to recall where she was and states that she was trying to speak but the words would not come out. She states she's been under a great amount of stress recently but otherwise has had no recent changes in her medication. She states she's had panic attacks in the past but this was not like a panic attack. She states that she cannot remember getting to where she was going but she does remember seeing street signs and stopping the car. She was unable to get her words out and was stuttering she states that made her very anxious and she started to become hysterical.  Patient is a 36 y.o. female presenting with altered mental status. The history is provided by the patient.  Altered Mental Status This is a new problem. The current episode started less than 1 hour ago. The problem occurs constantly. The problem has been resolved. Pertinent negatives include no chest pain, no abdominal pain, no headaches and no shortness of breath. The symptoms are aggravated by nothing. The symptoms are relieved by nothing. She has tried nothing for the symptoms. The treatment provided significant relief.    Past Medical History  Diagnosis Date  . HIV (human immunodeficiency virus infection)   . Migraine   . AIDS   . Fibroid   . Thrombocytopenia   . Irregular menses   . Migraine headache   . Asthma   . Cyst, dermoid, arm   . Cellulitis   . Bronchitis     No past surgical history on file.  No family history on file.  History  Substance Use Topics  . Smoking status: Current Everyday Smoker -- 1.0 packs/day  . Smokeless tobacco: Not  on file  . Alcohol Use: No    OB History    Grav Para Term Preterm Abortions TAB SAB Ect Mult Living                  Review of Systems  Respiratory: Negative for shortness of breath.   Cardiovascular: Negative for chest pain.  Gastrointestinal: Negative for abdominal pain.  Skin:       Diffuse lesions on the face and upper extremities. She states there is a new area on her left wrist that had some pus drainage  Neurological: Negative for headaches.  Psychiatric/Behavioral: Positive for altered mental status.  All other systems reviewed and are negative.    Allergies  Sulfa antibiotics  Home Medications   Current Outpatient Rx  Name Route Sig Dispense Refill  . ALBUTEROL SULFATE HFA 108 (90 BASE) MCG/ACT IN AERS Inhalation Inhale 2 puffs into the lungs every 6 (six) hours as needed. As needed for shortness of breath 6.7 Inhaler 4  . CITALOPRAM HYDROBROMIDE 40 MG PO TABS Oral Take 1 tablet (40 mg total) by mouth daily. 30 tablet 11  . CLONAZEPAM 0.5 MG PO TABS Oral Take 0.5 mg by mouth at bedtime as needed. For sleep    . EMTRICITABINE-TENOFOVIR 200-300 MG PO TABS Oral Take 1 tablet by mouth daily. 30 tablet 12  . FLUTICASONE PROPIONATE 50 MCG/ACT NA SUSP Nasal Place 2 sprays into the nose daily as  needed. For allergies    . RALTEGRAVIR POTASSIUM 400 MG PO TABS Oral Take 1 tablet (400 mg total) by mouth 2 (two) times daily. 60 tablet 12  . SODIUM CHLORIDE 0.65 % NA SOLN Nasal Place 1 spray into the nose daily as needed. For allergies      BP 127/86  Pulse 72  Temp(Src) 97.8 F (36.6 C) (Oral)  Resp 16  SpO2 100%  LMP 04/07/2012  Physical Exam  Nursing note and vitals reviewed. Constitutional: She is oriented to person, place, and time. She appears well-developed and well-nourished. She appears distressed.  HENT:  Head: Normocephalic and atraumatic.  Eyes: EOM are normal. Pupils are equal, round, and reactive to light.  Cardiovascular: Normal rate, regular rhythm,  normal heart sounds and intact distal pulses.  Exam reveals no friction rub.   No murmur heard. Pulmonary/Chest: Effort normal and breath sounds normal. She has no wheezes. She has no rales.  Abdominal: Soft. Bowel sounds are normal. She exhibits no distension. There is no tenderness. There is no rebound and no guarding.  Musculoskeletal: Normal range of motion. She exhibits no tenderness.       No edema  Neurological: She is alert and oriented to person, place, and time. She has normal strength. No cranial nerve deficit or sensory deficit. She displays a negative Romberg sign. Coordination and gait normal.  Skin: Skin is warm and dry. Bruising and lesion noted. No rash noted.       Lesions diffusely over the patient's space upper extremities and lower extremities. There is a small abscess on her left wrist. Also diffuse bruising over the lower extremities  Psychiatric: Her behavior is normal. Her mood appears anxious.    ED Course  Procedures (including critical care time)  Labs Reviewed  URINALYSIS, ROUTINE W REFLEX MICROSCOPIC - Abnormal; Notable for the following:    Ketones, ur 15 (*)    All other components within normal limits  URINE RAPID DRUG SCREEN (HOSP PERFORMED) - Abnormal; Notable for the following:    Amphetamines POSITIVE (*)    All other components within normal limits  APTT - Abnormal; Notable for the following:    aPTT 23 (*)    All other components within normal limits  CBC  DIFFERENTIAL  COMPREHENSIVE METABOLIC PANEL  PREGNANCY, URINE  PROTIME-INR   Ct Head Wo Contrast  04/13/2012  *RADIOLOGY REPORT*  Clinical Data:  Syncopal episode.  Dizziness.  CT HEAD WITHOUT CONTRAST  Technique: Contiguous axial images were obtained from the base of the skull through the vertex without contrast.  Comparison: None.  Findings: Normal appearing cerebral hemispheres and posterior fossa structures.  Normal size and position of the ventricles.  No intracranial hemorrhage, mass  lesion or evidence of acute infarction.  Unremarkable bones and included portions of the paranasal sinuses.  IMPRESSION: Normal examination.  Original Report Authenticated By: Darrol Angel, M.D.     1. Abscess   2. Cellulitis   3. Altered level of consciousness       MDM   Patient with a global symptoms that started today. She states she was driving in her car and suddenly did not know where she was going. she states that she was trying to speak but couldn't get the words out which made her very anxious. She denied any headaches or one-sided weakness and on arrival here patient is within normal limits. She denies any drug use but does have a history of HIV currently still on medications. No prior history  of issues but states recently she's had diffuse bruising.  Based on patient's symptoms concerning for possible TIA in due to the inability to get her words out and she is at higher risk given her past medical history of HIV. Possibility for atypical migraine however patient denies a headache which would make this less likely. Patient does have a history of panic attacks however she states this is nothing like a panic attack is quite calm on exam. She does have a history of cellulitis in the past and has lesions diffusely on her body which she states has been from her HIV in the past. There is a small abscess on her left wrist with some mild surrounding cellulitis but no streaking or signs of underlying infection.  CBC, CMP, UA, UDS, UPT, EKG, head CT pending.  8:07 PM  Labs are within normal limits and head CT is negative. UDS came back positive for amphetamines even though the patient denied taking any drugs. Discussed with her that this may be the reason for her symptoms today. Patient will followup with her PCP and was given a referral to neurology. She was also started on clindamycin due to the abscess and small amount of cellulitis on her wrist.    Gwyneth Sprout, MD 04/13/12  2008  Gwyneth Sprout, MD 04/13/12 2012

## 2012-04-13 NOTE — ED Notes (Signed)
Brought in by ems from home for anxiety. CBG 100

## 2012-04-13 NOTE — Discharge Instructions (Signed)
Abscess An abscess (boil or furuncle) is an infected area under your skin. This area is filled with yellowish white fluid (pus). HOME CARE   Only take medicine as told by your doctor.   Keep the skin clean around your abscess. Keep clothes that may touch the abscess clean.   Change any bandages (dressings) as told by your doctor.   Avoid direct skin contact with other people. The infection can spread by skin contact with others.   Practice good hygiene and do not share personal care items.   Do not share athletic equipment, towels, or whirlpools. Shower after every practice or work out session.   If a draining area cannot be covered:   Do not play sports.   Children should not go to daycare until the wound has healed or until fluid (drainage) stops coming out of the wound.   See your doctor for a follow-up visit as told.  GET HELP RIGHT AWAY IF:   There is more pain, puffiness (swelling), and redness in the wound site.   There is fluid or bleeding from the wound site.   You have muscle aches, chills, fever, or feel sick.   You or your child has a temperature by mouth above 102 F (38.9 C), not controlled by medicine.   Your baby is older than 3 months with a rectal temperature of 102 F (38.9 C) or higher.  MAKE SURE YOU:   Understand these instructions.   Will watch your condition.   Will get help right away if you are not doing well or get worse.  Document Released: 04/19/2008 Document Revised: 10/21/2011 Document Reviewed: 04/19/2008 [employer] Patient Information 2012 [employer]. 

## 2012-04-27 ENCOUNTER — Other Ambulatory Visit: Payer: Self-pay

## 2012-05-10 ENCOUNTER — Telehealth: Payer: Self-pay | Admitting: Licensed Clinical Social Worker

## 2012-05-10 NOTE — Telephone Encounter (Signed)
Patient missed lab appointment on 04/27/2012, I called the patient and left a message on her voicemail that I was going to cancel her appointment and she will need to make an appointment for a lab visit. If she is sick then we can put her back on the schedule.

## 2012-05-11 ENCOUNTER — Ambulatory Visit: Payer: Self-pay | Admitting: Infectious Disease

## 2012-06-08 ENCOUNTER — Other Ambulatory Visit: Payer: Self-pay | Admitting: Licensed Clinical Social Worker

## 2012-06-08 DIAGNOSIS — F419 Anxiety disorder, unspecified: Secondary | ICD-10-CM

## 2012-06-08 MED ORDER — CLONAZEPAM 0.5 MG PO TABS: 0.5000 mg | ORAL_TABLET | Freq: Every evening | ORAL | Status: DC | PRN

## 2012-07-06 ENCOUNTER — Telehealth: Payer: Self-pay | Admitting: *Deleted

## 2012-07-06 NOTE — Telephone Encounter (Signed)
Patient called c/o flu like symptoms.  No available appts today, patient said she will go to urgent care. Wendall Mola CMA

## 2012-07-13 ENCOUNTER — Other Ambulatory Visit: Payer: Self-pay

## 2012-07-26 ENCOUNTER — Other Ambulatory Visit: Payer: Self-pay | Admitting: *Deleted

## 2012-07-26 ENCOUNTER — Ambulatory Visit: Payer: Self-pay | Admitting: Family Medicine

## 2012-07-26 VITALS — BP 110/72 | HR 72 | Temp 98.4°F | Resp 16 | Ht 61.0 in | Wt 120.6 lb

## 2012-07-26 DIAGNOSIS — R350 Frequency of micturition: Secondary | ICD-10-CM

## 2012-07-26 DIAGNOSIS — J329 Chronic sinusitis, unspecified: Secondary | ICD-10-CM

## 2012-07-26 DIAGNOSIS — F419 Anxiety disorder, unspecified: Secondary | ICD-10-CM

## 2012-07-26 LAB — POCT UA - MICROSCOPIC ONLY
Crystals, Ur, HPF, POC: NEGATIVE
Mucus, UA: NEGATIVE
WBC, Ur, HPF, POC: NEGATIVE
Yeast, UA: NEGATIVE

## 2012-07-26 LAB — POCT URINALYSIS DIPSTICK
Bilirubin, UA: NEGATIVE
Blood, UA: NEGATIVE
Ketones, UA: NEGATIVE
Leukocytes, UA: NEGATIVE
Protein, UA: NEGATIVE
pH, UA: 8.5

## 2012-07-26 MED ORDER — CLONAZEPAM 0.5 MG PO TABS: 0.5000 mg | ORAL_TABLET | Freq: Every evening | ORAL | Status: DC | PRN

## 2012-07-26 MED ORDER — AMOXICILLIN-POT CLAVULANATE 875-125 MG PO TABS: 1.0000 | ORAL_TABLET | Freq: Two times a day (BID) | ORAL | Status: AC

## 2012-07-26 NOTE — Progress Notes (Signed)
[facility] [address] [place] [address] [phone]  Date:  07/26/2012   Name:  Cassandra Rogers   DOB:  08-17-1976   MRN:  [id number]  PCP:  Acey Lav, MD    Chief Complaint: Otalgia   History of Present Illness:  Cassandra Rogers is a 36 y.o. very pleasant female patient who presents with the following:  Her kids have been ill with a cold recently.  She first noted cough, headache, congestion a couple of weeks ago.  She had some sinus symptoms, and then developed pain in her right ear.  She is now mostly better, except she continues to have pain in the right ear.  If wind blows in her ear it is painful.  She had a bad headache a few days ago which is now better.    She also noted some urinary frequency.  This has been worse for the last several days, and she has dysuria.  Also urgency.  No hematuria.    She felt "cranky" yesterday.  Some nausea but no vomiting.   She has not noted a fever LMP was 07/08/12.    She is followed by infectious disease for her HIV disease.  He last labs were about 4 months ago- at that point her viral load was undetectable.    Patient Active Problem List  Diagnosis  . Human immunodeficiency virus (HIV) disease  . Screening examination for venereal disease  . Encounter for long-term (current) use of other medications  . Irregular menses  . Migraine headache  . Asthma  . Cyst, dermoid, arm  . Bronchitis  . Depression  . PTSD (post-traumatic stress disorder)  . HIV (human immunodeficiency virus infection)  . Fungal skin infection    Past Medical History  Diagnosis Date  . HIV (human immunodeficiency virus infection)   . Migraine   . AIDS   . Fibroid   . Thrombocytopenia   . Irregular menses   . Migraine headache   . Asthma   . Cyst, dermoid, arm   . Cellulitis   . Bronchitis     No past surgical history on file.  History  Substance Use Topics  . Smoking status: Current Every Day Smoker -- 1.0  packs/day  . Smokeless tobacco: Not on file  . Alcohol Use: No    No family history on file.  Allergies  Allergen Reactions  . Sulfa Antibiotics Other (See Comments)    Fever    Medication list has been reviewed and updated.  Current Outpatient Prescriptions on File Prior to Visit  Medication Sig Dispense Refill  . albuterol (PROVENTIL HFA;VENTOLIN HFA) 108 (90 BASE) MCG/ACT inhaler Inhale 2 puffs into the lungs every 6 (six) hours as needed. As needed for shortness of breath  6.7 Inhaler  4  . citalopram (CELEXA) 40 MG tablet Take 1 tablet (40 mg total) by mouth daily.  30 tablet  11  . emtricitabine-tenofovir (TRUVADA) 200-300 MG per tablet Take 1 tablet by mouth daily.  30 tablet  12  . fluticasone (FLONASE) 50 MCG/ACT nasal spray Place 2 sprays into the nose daily as needed. For allergies      . Multiple Vitamin (MULTIVITAMIN) tablet Take 1 tablet by mouth daily.      . raltegravir (ISENTRESS) 400 MG tablet Take 1 tablet (400 mg total) by mouth 2 (two) times daily.  60 tablet  12  . sodium chloride (OCEAN) 0.65 % nasal spray Place 1 spray  into the nose daily as needed. For allergies      . clonazePAM (KLONOPIN) 0.5 MG tablet Take 1 tablet (0.5 mg total) by mouth at bedtime.  30 tablet  4  . clonazePAM (KLONOPIN) 0.5 MG tablet Take 1 tablet (0.5 mg total) by mouth at bedtime as needed. For sleep  30 tablet  0  . oxyCODONE-acetaminophen (PERCOCET) 10-325 MG per tablet Take 1 tablet by mouth every 4 (four) hours as needed. Patient used this medication for foot pain.        Review of Systems:  As per HPI- otherwise negative. She has not noted a fever or any signs of severe illness No vaginal symptoms or abdominal pain Physical Examination: Filed Vitals:   07/26/12 1049  BP: 110/72  Pulse: 72  Temp: 98.4 F (36.9 C)  Resp: 16   Filed Vitals:   07/26/12 1049  Height: 5\' 1"  (1.549 m)  Weight: 120 lb 9.6 oz (54.704 kg)   Body mass index is 22.79 kg/(m^2). Ideal Body  Weight: Weight in (lb) to have BMI = 25: 132   GEN: WDWN, NAD, Non-toxic, A & O x 3, looks well.   HEENT: Atraumatic, Normocephalic. Neck supple. No masses, No LAD.  There is some clear fluid behind the right TM, but ear canal and left side are normal.  oropharynx wnl, PEERl Ears and Nose: No external deformity. CV: RRR, No M/G/R. No JVD. No thrill. No extra heart sounds. PULM: CTA B, no wheezes, crackles, rhonchi. No retractions. No resp. distress. No accessory muscle use. ABD: S,  ND, +BS. No rebound. No HSM.  Minimal tenderness in suprapubic area only.   EXTR: No c/c/e NEURO Normal gait.  PSYCH: Normally interactive. Conversant. Not depressed or anxious appearing.  Calm demeanor.   Results for orders placed in visit on 07/26/12  POCT UA - MICROSCOPIC ONLY      Component Value Range   WBC, Ur, HPF, POC neg     RBC, urine, microscopic neg     Bacteria, U Microscopic 2+     Mucus, UA neg     Epithelial cells, urine per micros 5-9     Crystals, Ur, HPF, POC neg     Casts, Ur, LPF, POC neg     Yeast, UA neg    POCT URINALYSIS DIPSTICK      Component Value Range   Color, UA yellow     Clarity, UA cloudy     Glucose, UA neg     Bilirubin, UA neg     Ketones, UA neg     Spec Grav, UA 1.020     Blood, UA neg     pH, UA 8.5     Protein, UA neg     Urobilinogen, UA 0.2     Nitrite, UA neg     Leukocytes, UA Negative      Assessment and Plan: 1. Urinary frequency  POCT UA - Microscopic Only, POCT urinalysis dipstick, amoxicillin-clavulanate (AUGMENTIN) 875-125 MG per tablet, Urine culture  2. Sinusitis  amoxicillin-clavulanate (AUGMENTIN) 875-125 MG per tablet   Possible UTI- await urine culture.  In the meantime will cover for both UTI and possible sinusitis with Augmentin.  She will push fluids, let me know if not getting better- Sooner if worse.   Will follow- up when urine culture available.    Abbe Amsterdam, MD

## 2012-07-26 NOTE — Telephone Encounter (Signed)
Pt requested refill.  Done.

## 2012-07-27 ENCOUNTER — Ambulatory Visit: Payer: Self-pay | Admitting: Family Medicine

## 2012-07-27 ENCOUNTER — Ambulatory Visit: Payer: Self-pay | Admitting: Infectious Disease

## 2012-07-27 ENCOUNTER — Ambulatory Visit: Payer: Self-pay

## 2012-07-27 ENCOUNTER — Other Ambulatory Visit: Payer: Self-pay | Admitting: Family Medicine

## 2012-07-27 VITALS — BP 96/54 | HR 82 | Temp 97.8°F | Resp 14 | Ht 61.0 in | Wt 120.0 lb

## 2012-07-27 DIAGNOSIS — S99921A Unspecified injury of right foot, initial encounter: Secondary | ICD-10-CM

## 2012-07-27 DIAGNOSIS — M79609 Pain in unspecified limb: Secondary | ICD-10-CM

## 2012-07-27 MED ORDER — OXYCODONE-ACETAMINOPHEN 10-325 MG PO TABS: 1.0000 | ORAL_TABLET | Freq: Three times a day (TID) | ORAL | Status: DC | PRN

## 2012-07-27 NOTE — Progress Notes (Signed)
Reviewed and agree.

## 2012-07-27 NOTE — Progress Notes (Signed)
  Subjective:    Patient ID: Cassandra Rogers, female    DOB: April 28, 1976, 36 y.o.   MRN: [id number]  HPI   Yest night pt accidentally hit a wood stove with the top of her right foot when she was walking. She iced it all night which helped and soaked it in hot water this a.m. To help w/ the pain.  Pt was previously on prn percocet 10/325mg  through pain management in [place], [place] after she crushed her right calcaneous about 3 yrs prev from a second story fall. She was able to wean herself off the percocet (#75/mo) about 1-2 mos ago but now thinks she will need to go back on it due to this injury. She has an appt w/ pain management in 1 mo. Her prior orthopedic surgeons are also in [place] and states no one in [place] will see her. She is able to bear weight on her foot but is limping. She has an aircast and crutches in storage at home from her prior injury.   Review of Systems  Cardiovascular: Negative for leg swelling.  Musculoskeletal: Positive for myalgias, joint swelling, arthralgias and gait problem.  Skin: Positive for color change and wound.  Neurological: Positive for weakness. Negative for tremors and numbness.       Objective:   Physical Exam  Constitutional: She is oriented to person, place, and time. She appears well-developed and well-nourished. No distress.  Eyes: Pupils are equal, round, and reactive to light. No scleral icterus.  Cardiovascular:  Pulses:      Posterior tibial pulses are 2+ on the right side.       Nml cap refill in Rt toes, no edema other than area of contusion  Musculoskeletal:       Feet:       Well-defined area of bluish swelling over 2-4th mid-metatarsals w/ central superficial small abrasion.  Slightly reduced passive ROM in 2-4th MTP joints, nml in all others. Greatly reduced AROM in 2-4th MTP. Neg squeeze test, nml ankle stability. Unable to palpate MTs due to severity of pain.  Neurological: She is alert and oriented to person, place, and time.  Skin:  Skin is warm and dry. She is not diaphoretic.  Psychiatric: She has a normal mood and affect. Her behavior is normal.      [facility] reading (PRIMARY) by  Dr. Norberto Sorenson.  Large amount of soft tissue swelling seen as well as hardware from old well healed calcaneal fracture but no acute bony injury seen over injury area of 2-4 metatarsals.    Assessment & Plan:  1. Rt foot contusion - No fracture seen but due to acuity of pain and swelling will treat with camboot - stay off as much as possible, elevate. Use crutches to aid in walking if going any distance. Ice 15 min 4x/day minimum. Rect otc ibuprofen or aleve and refilled percocet 10 #30. RTC in 4d for repeat eval- hopefully better exam then with decreased swelling.

## 2012-08-08 ENCOUNTER — Other Ambulatory Visit: Payer: Self-pay

## 2012-08-08 DIAGNOSIS — B20 Human immunodeficiency virus [HIV] disease: Secondary | ICD-10-CM

## 2012-08-08 LAB — CBC WITH DIFFERENTIAL/PLATELET
Basophils Absolute: 0 10*3/uL (ref 0.0–0.1)
Basophils Relative: 1 % (ref 0–1)
Eosinophils Absolute: 0.3 10*3/uL (ref 0.0–0.7)
Eosinophils Relative: 7 % — ABNORMAL HIGH (ref 0–5)
Lymphs Abs: 1.7 10*3/uL (ref 0.7–4.0)
MCH: 29.5 pg (ref 26.0–34.0)
MCHC: 34.7 g/dL (ref 30.0–36.0)
MCV: 84.9 fL (ref 78.0–100.0)
Neutrophils Relative %: 47 % (ref 43–77)
Platelets: 255 10*3/uL (ref 150–400)
RDW: 14.3 % (ref 11.5–15.5)

## 2012-08-09 LAB — COMPLETE METABOLIC PANEL WITH GFR
ALT: 15 U/L (ref 0–35)
AST: 18 U/L (ref 0–37)
Albumin: 4.8 g/dL (ref 3.5–5.2)
CO2: 29 mEq/L (ref 19–32)
Calcium: 9.8 mg/dL (ref 8.4–10.5)
Chloride: 101 mEq/L (ref 96–112)
GFR, Est African American: >89 mL/min
Potassium: 4.2 mEq/L (ref 3.5–5.3)
Total Protein: 7.2 g/dL (ref 6.0–8.3)

## 2012-08-09 LAB — LIPID PANEL
Cholesterol: 191 mg/dL (ref 0–200)
Total CHOL/HDL Ratio: 3.8 Ratio

## 2012-08-09 LAB — T-HELPER CELL (CD4) - (RCID CLINIC ONLY)
CD4 % Helper T Cell: 40 % (ref 33–55)
CD4 T Cell Abs: 700 uL (ref 400–2700)

## 2012-08-09 LAB — HIV-1 RNA QUANT-NO REFLEX-BLD
HIV 1 RNA Quant: <20 copies/mL (ref <20)
HIV-1 RNA Quant, Log: <1.3 {Log} (ref <1.30)

## 2012-08-09 LAB — RPR

## 2012-08-23 ENCOUNTER — Ambulatory Visit: Payer: Self-pay | Admitting: Infectious Disease

## 2012-09-18 ENCOUNTER — Encounter: Payer: Self-pay | Admitting: Infectious Diseases

## 2012-09-18 ENCOUNTER — Telehealth: Payer: Self-pay | Admitting: *Deleted

## 2012-09-18 ENCOUNTER — Ambulatory Visit (INDEPENDENT_AMBULATORY_CARE_PROVIDER_SITE_OTHER): Payer: Self-pay | Admitting: Infectious Diseases

## 2012-09-18 VITALS — BP 129/86 | HR 71 | Temp 98.1°F | Ht 61.5 in | Wt 119.0 lb

## 2012-09-18 DIAGNOSIS — B20 Human immunodeficiency virus [HIV] disease: Secondary | ICD-10-CM

## 2012-09-18 DIAGNOSIS — F411 Generalized anxiety disorder: Secondary | ICD-10-CM

## 2012-09-18 DIAGNOSIS — N760 Acute vaginitis: Secondary | ICD-10-CM

## 2012-09-18 DIAGNOSIS — J329 Chronic sinusitis, unspecified: Secondary | ICD-10-CM | POA: Insufficient documentation

## 2012-09-18 DIAGNOSIS — F329 Major depressive disorder, single episode, unspecified: Secondary | ICD-10-CM

## 2012-09-18 DIAGNOSIS — F419 Anxiety disorder, unspecified: Secondary | ICD-10-CM

## 2012-09-18 DIAGNOSIS — Z23 Encounter for immunization: Secondary | ICD-10-CM

## 2012-09-18 MED ORDER — AZITHROMYCIN 250 MG PO TABS: ORAL_TABLET | ORAL | Status: DC

## 2012-09-18 MED ORDER — FLUCONAZOLE 100 MG PO TABS: 100.0000 mg | ORAL_TABLET | Freq: Every day | ORAL | Status: DC

## 2012-09-18 MED ORDER — EMTRICITABINE-TENOFOVIR DF 200-300 MG PO TABS: 1.0000 | ORAL_TABLET | Freq: Every day | ORAL | Status: AC

## 2012-09-18 MED ORDER — CITALOPRAM HYDROBROMIDE 40 MG PO TABS: 40.0000 mg | ORAL_TABLET | Freq: Every day | ORAL | Status: DC

## 2012-09-18 MED ORDER — RALTEGRAVIR POTASSIUM 400 MG PO TABS: 400.0000 mg | ORAL_TABLET | Freq: Two times a day (BID) | ORAL | Status: AC

## 2012-09-18 MED ORDER — CLONAZEPAM 0.5 MG PO TABS: 0.5000 mg | ORAL_TABLET | Freq: Every evening | ORAL | Status: DC | PRN

## 2012-09-18 NOTE — Telephone Encounter (Signed)
Patient called and reports she has been feeling bad for about 3 weeks. She reports she is not on her meds because her ADAP lapsed. She also advised she has had a cough, thrush in her mouth and throat, nose bleeds and not sleeping very well for about 3 weeks also. She also states she had run a intermittent fever for the past week. Offered her an appt with Dr Ninetta Lights today at 315 pm as he had an available slot and she accepted it and will be here at that time.

## 2012-09-18 NOTE — Assessment & Plan Note (Signed)
Will have her get in with mental health counselor Bernette Redbird) here in the clinic.

## 2012-09-18 NOTE — Assessment & Plan Note (Addendum)
She needs to get back on her ART. Will refill, gets flu shot, offered condoms. Will see her back in 2-3 months. Has had pap (april) this year. Possibly onto dolutegravir FDC at f/u appt?

## 2012-09-18 NOTE — Progress Notes (Signed)
  Subjective:    Patient ID: Cassandra Rogers, female    DOB: [birth year], 36 y.o.   MRN: [id number]  HPI 36 yo F with hx of HIV+ since 36 yo. She is currently maintained on ISN/TRV. 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  Was having trouble with her ART, then ADAP lapsed.  Has been feeling more fatigued since off meds. Had episode of poison ivy and had to get steroid injection/prednisone taper/doxy.   Feels like she has gotten cold after being around her sick kids and relatives. Had fever initially. Took OTCs, neit-pot. Has been having sinus d/c. Ears popping. Feels like she has gotten thrush as well (has noted white areas, blisters).  Can eat and drink without difficulty. Having post-prandial diarrhea.   HIV 1 RNA Quant (copies/mL)  Date Value  08/08/2012 <20   02/09/2012 <20   11/24/2011 <20      CD4 T Cell Abs (cmm)  Date Value  08/08/2012 700   02/09/2012 520   11/24/2011 490       Review of Systems     Objective:   Physical Exam  Constitutional: She appears well-developed and well-nourished.  HENT:  Head:    Mouth/Throat: No oropharyngeal exudate.  Eyes: EOM are normal. Pupils are equal, round, and reactive to light.  Neck: Neck supple.  Cardiovascular: Normal rate, regular rhythm and normal heart sounds.   Pulmonary/Chest: Effort normal and breath sounds normal.  Abdominal: Soft. Bowel sounds are normal. She exhibits no distension. There is no tenderness.  Musculoskeletal: She exhibits no edema.  Lymphadenopathy:    She has cervical adenopathy.          Assessment & Plan:

## 2012-09-18 NOTE — Assessment & Plan Note (Signed)
Will give her a z-pack. Will refill her diflucan for yeast infection that she typically gets with anbx

## 2012-10-12 ENCOUNTER — Other Ambulatory Visit: Payer: Self-pay | Admitting: Infectious Diseases

## 2012-10-26 ENCOUNTER — Encounter: Payer: Self-pay | Admitting: Internal Medicine

## 2012-10-26 ENCOUNTER — Telehealth: Payer: Self-pay | Admitting: *Deleted

## 2012-10-26 ENCOUNTER — Ambulatory Visit (INDEPENDENT_AMBULATORY_CARE_PROVIDER_SITE_OTHER): Payer: Medicaid Other | Admitting: Internal Medicine

## 2012-10-26 VITALS — BP 125/82 | HR 76 | Temp 98.0°F | Ht 61.5 in | Wt 120.0 lb

## 2012-10-26 DIAGNOSIS — B009 Herpesviral infection, unspecified: Secondary | ICD-10-CM | POA: Insufficient documentation

## 2012-10-26 DIAGNOSIS — J329 Chronic sinusitis, unspecified: Secondary | ICD-10-CM

## 2012-10-26 MED ORDER — VALACYCLOVIR HCL 1 G PO TABS: 1000.0000 mg | ORAL_TABLET | Freq: Two times a day (BID) | ORAL | Status: DC

## 2012-10-26 MED ORDER — CETIRIZINE HCL 10 MG PO TABS: 10.0000 mg | ORAL_TABLET | Freq: Every day | ORAL | Status: DC

## 2012-10-26 NOTE — Progress Notes (Signed)
  Subjective:    Patient ID: Cassandra Rogers, female    DOB: Mar 19, 1976, 35 y.o.   MRN: [id number]  HPI She comes in for complaint of continued URI symptoms.  She has sinus drainage, and ear fluid she notices. He did get a course of azithromycin with some relief but it came right back. She does smoke and she does have seasonal allergies appear to be worsened this time of year. In [place], her allergies were more in the summer.  She also has a complaint of a blisterlike lesion on her left inner thigh that is new and painful. It was unroofed and there was drainage out of it. No history of a lesion in that area before. She denies any history of herpes in the past.    Review of Systems  Constitutional: Negative for fever and chills.  HENT: Positive for congestion, rhinorrhea, postnasal drip and ear discharge. Negative for sore throat.   Skin:       She has a lesion on her left inner leg       Objective:   Physical Exam        Assessment & Plan:

## 2012-10-26 NOTE — Telephone Encounter (Signed)
Patient called stating she was given a z-pak in November for cold symptoms and never really improved. She also has 2 boils, I asked her if she has established with a PCP yet and she has not. She said she is use to her previous ID doctor taking care of everything. I told her that we are a specialty office and since she has insurance it would be helpful to establish with a PCP. Given appointment for today. Wendall Mola CMA

## 2012-10-26 NOTE — Assessment & Plan Note (Signed)
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 It is drying now so no cultures possible. Will give her course of Valtrex and see if that improves it. She does come back if it is not improving.

## 2012-10-26 NOTE — Assessment & Plan Note (Signed)
She is using saline spray and Flonase. I did prescribe her Zyrtec and discussed to quit smoking which is largely responsible for this. She is going to try that. She also is going to establish with primary physician to help manage chronic sinusitis. She was given the number for quit line.

## 2012-10-27 IMAGING — US US PELVIS COMPLETE
1 series · 14 of 25 positions shown · non-contrast
Comparison: The none.

CLINICAL DATA: Pelvic pain.

TRANSABDOMINAL AND TRANSVAGINAL ULTRASOUND OF PELVIS
DOPPLER ULTRASOUND OF OVARIES
TECHNIQUE: Both transabdominal and transvaginal ultrasound
examinations of the pelvis were performed including evaluation of
the uterus, ovaries, adnexal regions, and pelvic cul-de-sac. Color
and duplex Doppler ultrasound was utilized to evaluate blood flow
to the ovaries.

[Series 1: us pelvis complete · 0.26mm/px · 14 of 61 slices shown]
[im 1/61]
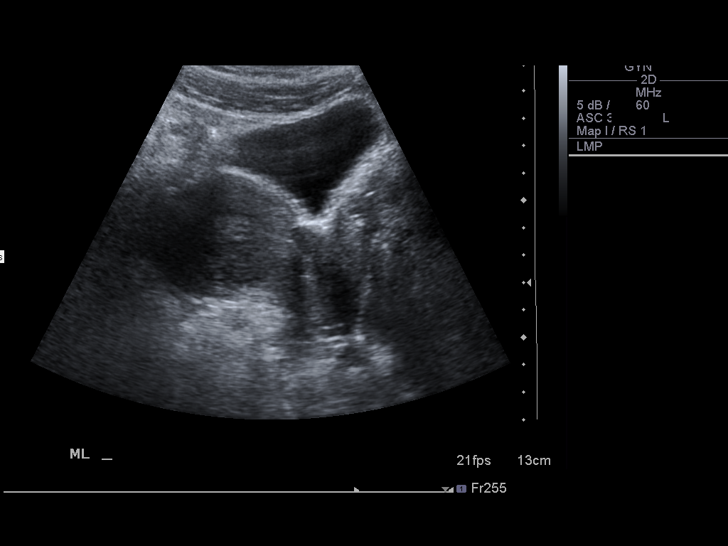
[im 6/61]
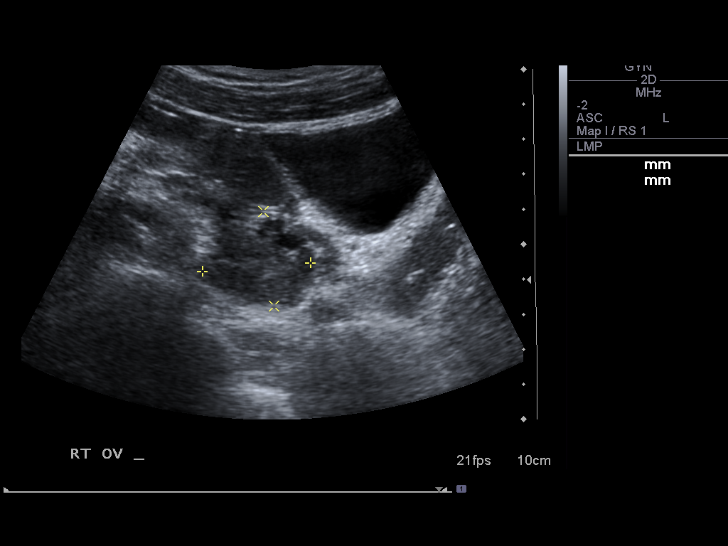
[im 11/61]
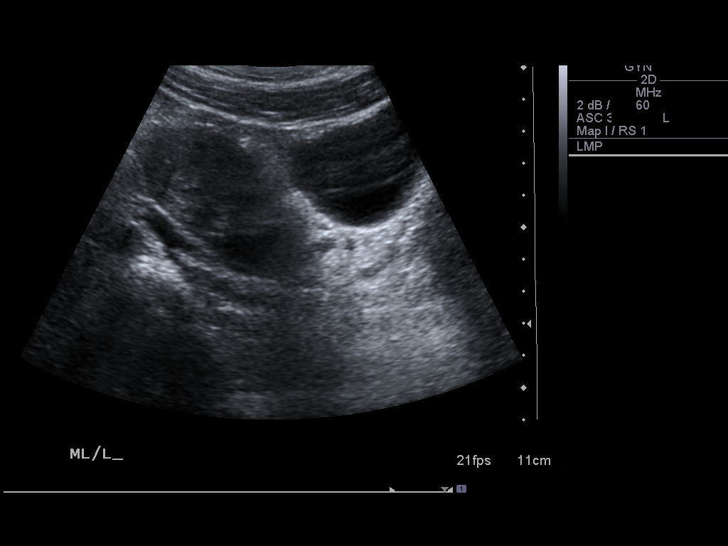
[im 16/61]
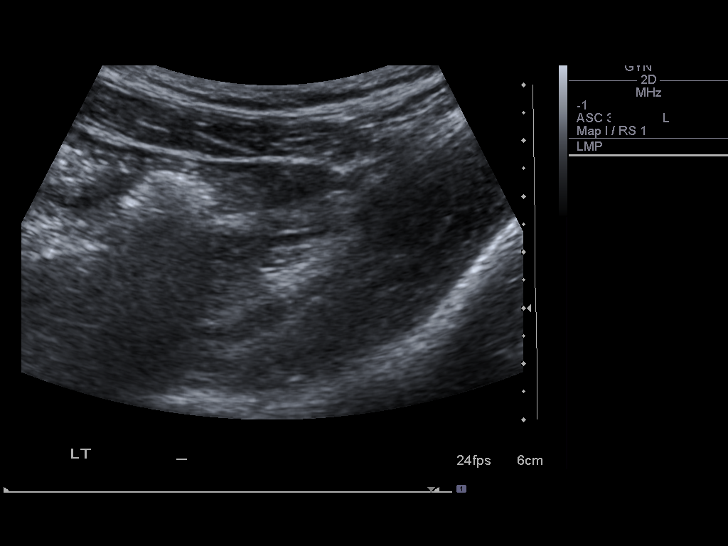
[im 21/61]
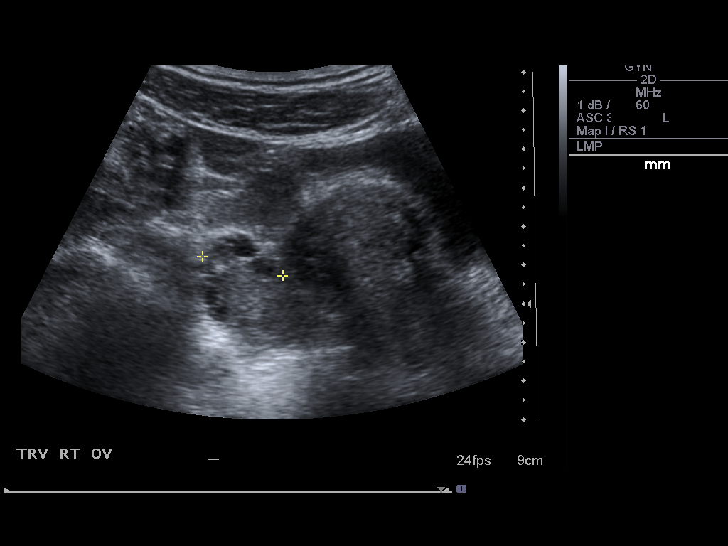
[im 23/61]
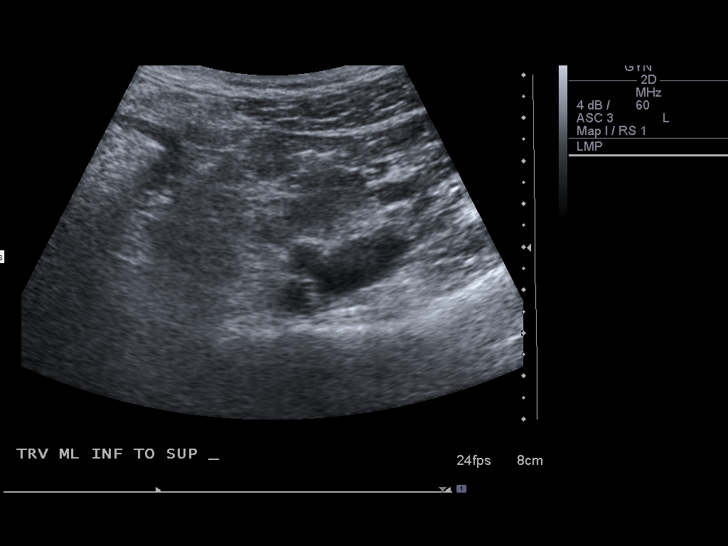
[im 28/61]
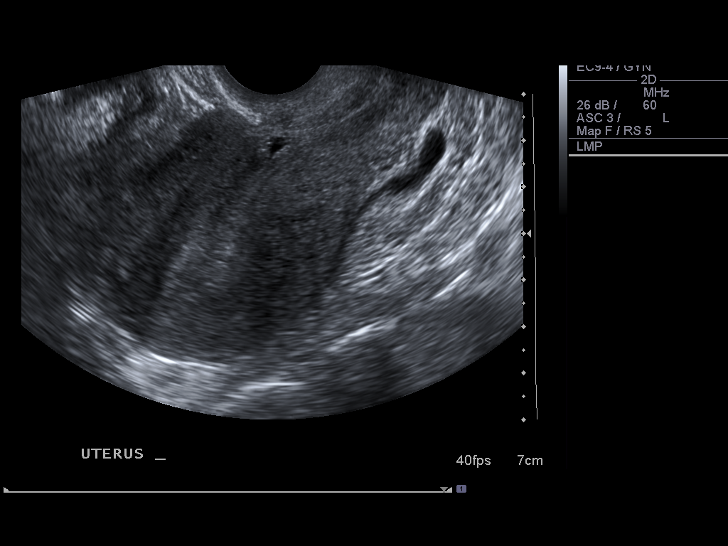
[im 33/61]
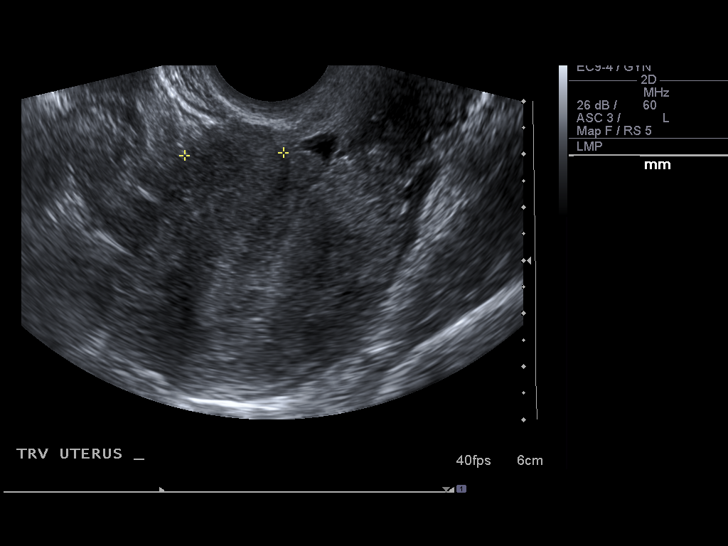
[im 38/61]
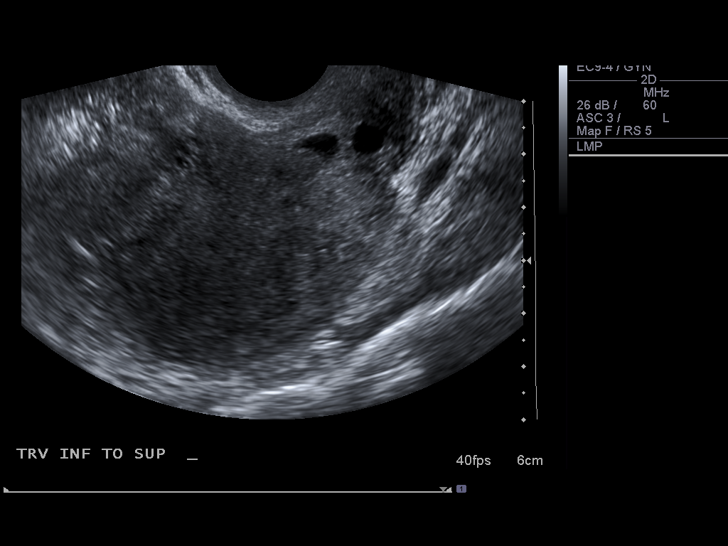
[im 41/61]
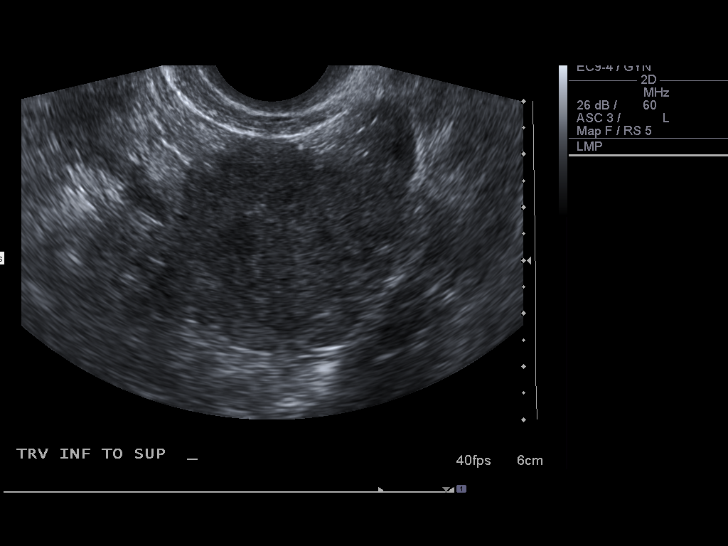
[im 46/61]
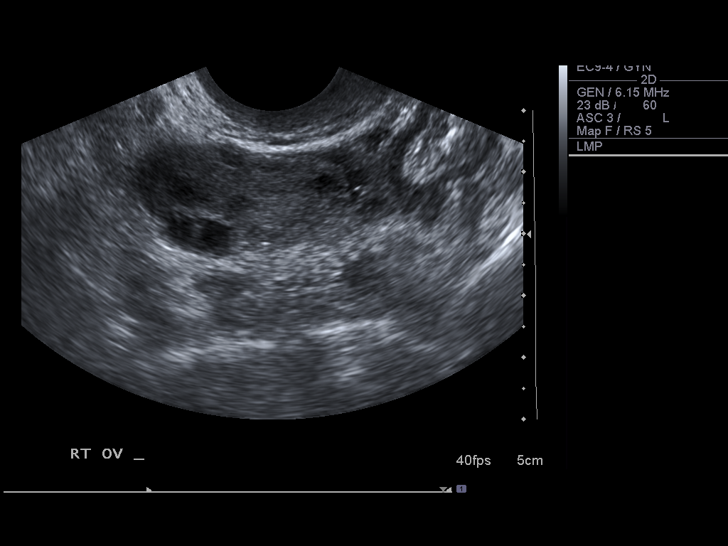
[im 51/61]
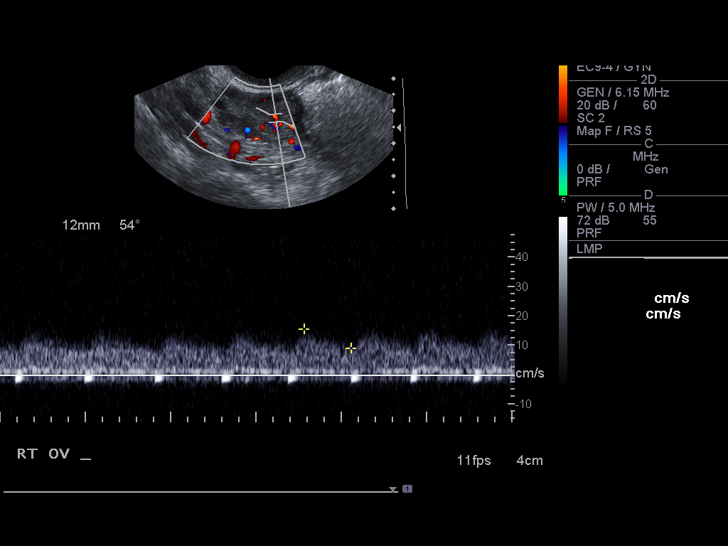
[im 56/61]
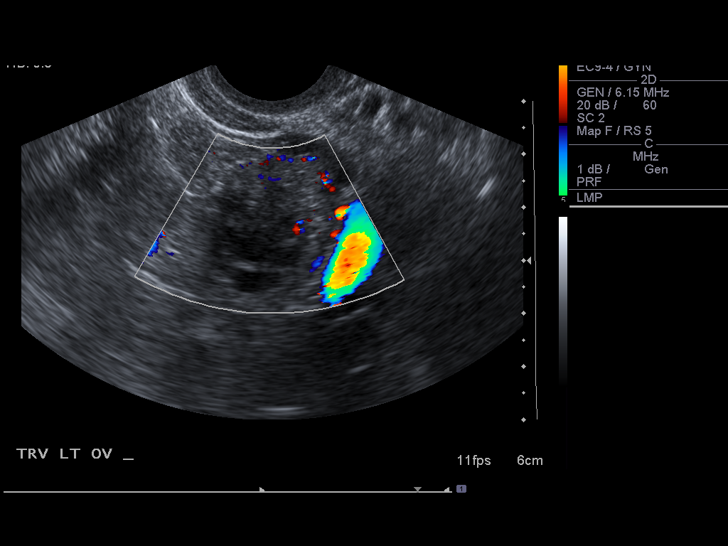
[im 61/61]
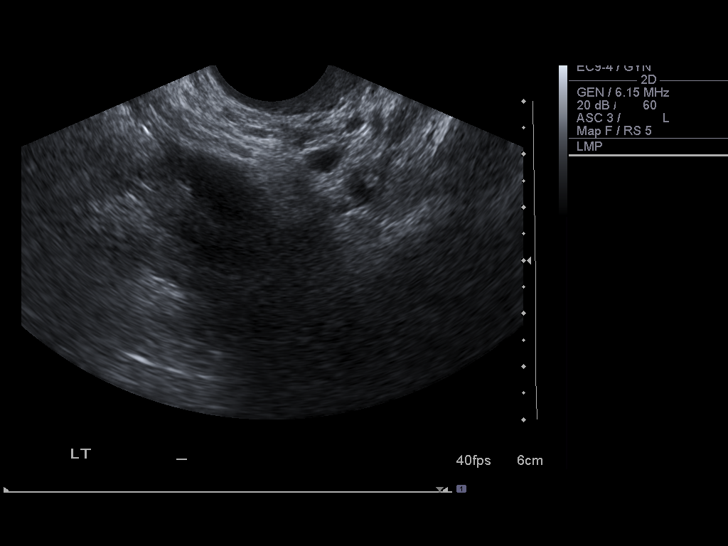

[14 of 25 positions shown; findings below may reference images not displayed]

FINDINGS: Uterus: 8.7 x 4.5 x 5.5 cm.  1.9 cm anterior fibroid.  Otherwise
echotexture is normal.

Endometrium: Normal appearance and thickness, 13 mm.

Right Ovary: 3.6 x 2.0 x 2.4 cm.  Multiple small follicles. Normal
size and echotexture.  No adnexal masses.  Normal arterial venous
blood flow.

Left Ovary: 2.2 x 1.7 x 2.7 cm.  Small follicles. Normal size and
echotexture.  No adnexal masses.  Normal arterial venous blood
flow.

Other Findings:  Small amount of free fluid in the pelvis.
IMPRESSION: Small anterior fundal fibroid.  Small amount of free fluid.  Likely
physiologic.

No acute findings.

## 2012-10-28 ENCOUNTER — Observation Stay (HOSPITAL_BASED_OUTPATIENT_CLINIC_OR_DEPARTMENT_OTHER)
Admission: EM | Admit: 2012-10-28 | Discharge: 2012-10-29 | Disposition: A | Discharge status: Home / Self Care | Payer: Self-pay | Attending: Internal Medicine | Admitting: Internal Medicine

## 2012-10-28 ENCOUNTER — Encounter (HOSPITAL_BASED_OUTPATIENT_CLINIC_OR_DEPARTMENT_OTHER): Payer: Self-pay | Admitting: Emergency Medicine

## 2012-10-28 DIAGNOSIS — Z21 Asymptomatic human immunodeficiency virus [HIV] infection status: Secondary | ICD-10-CM | POA: Insufficient documentation

## 2012-10-28 DIAGNOSIS — L03317 Cellulitis of buttock: Secondary | ICD-10-CM | POA: Diagnosis present

## 2012-10-28 DIAGNOSIS — F329 Major depressive disorder, single episode, unspecified: Secondary | ICD-10-CM

## 2012-10-28 DIAGNOSIS — L0231 Cutaneous abscess of buttock: Principal | ICD-10-CM | POA: Insufficient documentation

## 2012-10-28 DIAGNOSIS — F419 Anxiety disorder, unspecified: Secondary | ICD-10-CM

## 2012-10-28 DIAGNOSIS — Z79899 Other long term (current) drug therapy: Secondary | ICD-10-CM | POA: Insufficient documentation

## 2012-10-28 DIAGNOSIS — F3289 Other specified depressive episodes: Secondary | ICD-10-CM | POA: Insufficient documentation

## 2012-10-28 DIAGNOSIS — B20 Human immunodeficiency virus [HIV] disease: Secondary | ICD-10-CM | POA: Diagnosis present

## 2012-10-28 HISTORY — DX: Major depressive disorder, single episode, unspecified: F32.9

## 2012-10-28 HISTORY — DX: Depression, unspecified: F32.A

## 2012-10-28 LAB — CBC WITH DIFFERENTIAL/PLATELET
Eosinophils Absolute: 0.9 10*3/uL — ABNORMAL HIGH (ref 0.0–0.7)
Eosinophils Relative: 9 % — ABNORMAL HIGH (ref 0–5)
HCT: 39.3 % (ref 36.0–46.0)
Lymphocytes Relative: 19 % (ref 12–46)
Lymphs Abs: 1.8 10*3/uL (ref 0.7–4.0)
MCH: 29.2 pg (ref 26.0–34.0)
MCV: 84.9 fL (ref 78.0–100.0)
Monocytes Absolute: 0.7 10*3/uL (ref 0.1–1.0)
Monocytes Relative: 7 % (ref 3–12)
Platelets: 207 10*3/uL (ref 150–400)
RBC: 4.63 MIL/uL (ref 3.87–5.11)
WBC: 9.4 10*3/uL (ref 4.0–10.5)

## 2012-10-28 LAB — BASIC METABOLIC PANEL
BUN: 11 mg/dL (ref 6–23)
CO2: 24 mEq/L (ref 19–32)
Calcium: 8.8 mg/dL (ref 8.4–10.5)
Creatinine, Ser: 0.6 mg/dL (ref 0.50–1.10)
GFR calc non Af Amer: >90 mL/min (ref >90)
Glucose, Bld: 90 mg/dL (ref 70–99)

## 2012-10-28 MED ORDER — ACETAMINOPHEN 650 MG RE SUPP: 650.0000 mg | Freq: Four times a day (QID) | RECTAL | Status: DC | PRN

## 2012-10-28 MED ORDER — ENOXAPARIN SODIUM 40 MG/0.4ML ~~LOC~~ SOLN
40.0000 mg | Freq: Every day | SUBCUTANEOUS | Status: DC
  Administered 2012-10-28 – 2012-10-29 (×2): 40 mg via SUBCUTANEOUS
  Filled 2012-10-28 (×2): qty 0.4

## 2012-10-28 MED ORDER — FENTANYL CITRATE 0.05 MG/ML IJ SOLN
100.0000 ug | Freq: Once | INTRAMUSCULAR | Status: AC
  Administered 2012-10-28: 100 ug via INTRAVENOUS
  Filled 2012-10-28: qty 2

## 2012-10-28 MED ORDER — ONDANSETRON HCL 4 MG PO TABS: 4.0000 mg | ORAL_TABLET | Freq: Four times a day (QID) | ORAL | Status: DC | PRN

## 2012-10-28 MED ORDER — CLONAZEPAM 0.5 MG PO TABS
0.5000 mg | ORAL_TABLET | Freq: Every evening | ORAL | Status: DC | PRN
  Administered 2012-10-28: 0.5 mg via ORAL
  Filled 2012-10-28: qty 1

## 2012-10-28 MED ORDER — RALTEGRAVIR POTASSIUM 400 MG PO TABS
400.0000 mg | ORAL_TABLET | Freq: Two times a day (BID) | ORAL | Status: DC
Start: 2012-10-28 — End: 2012-10-29
  Administered 2012-10-28 – 2012-10-29 (×3): 400 mg via ORAL
  Filled 2012-10-28 (×4): qty 1

## 2012-10-28 MED ORDER — KETOROLAC TROMETHAMINE 15 MG/ML IJ SOLN
15.0000 mg | Freq: Three times a day (TID) | INTRAMUSCULAR | Status: DC | PRN
  Administered 2012-10-28 – 2012-10-29 (×3): 15 mg via INTRAVENOUS
  Filled 2012-10-28 (×4): qty 1

## 2012-10-28 MED ORDER — LEVOFLOXACIN 750 MG PO TABS
750.0000 mg | ORAL_TABLET | Freq: Once | ORAL | Status: AC
  Administered 2012-10-28: 750 mg via ORAL
  Filled 2012-10-28: qty 1

## 2012-10-28 MED ORDER — ACETAMINOPHEN 325 MG PO TABS
650.0000 mg | ORAL_TABLET | Freq: Four times a day (QID) | ORAL | Status: DC | PRN
  Administered 2012-10-29: 650 mg via ORAL
  Filled 2012-10-28: qty 2

## 2012-10-28 MED ORDER — CLINDAMYCIN HCL 300 MG PO CAPS
300.0000 mg | ORAL_CAPSULE | Freq: Three times a day (TID) | ORAL | Status: DC
  Administered 2012-10-28 – 2012-10-29 (×4): 300 mg via ORAL
  Filled 2012-10-28 (×6): qty 1

## 2012-10-28 MED ORDER — LIDOCAINE HCL 2 % IJ SOLN
INTRAMUSCULAR | Status: AC
  Filled 2012-10-28: qty 20

## 2012-10-28 MED ORDER — EMTRICITABINE-TENOFOVIR DF 200-300 MG PO TABS
1.0000 | ORAL_TABLET | Freq: Every day | ORAL | Status: DC
Start: 2012-10-28 — End: 2012-10-29
  Administered 2012-10-28 – 2012-10-29 (×2): 1 via ORAL
  Filled 2012-10-28 (×2): qty 1

## 2012-10-28 MED ORDER — VANCOMYCIN HCL IN DEXTROSE 1-5 GM/200ML-% IV SOLN
1000.0000 mg | Freq: Once | INTRAVENOUS | Status: AC
  Administered 2012-10-28: 1000 mg via INTRAVENOUS
  Filled 2012-10-28: qty 200

## 2012-10-28 MED ORDER — ONDANSETRON HCL 4 MG/2ML IJ SOLN: 4.0000 mg | Freq: Four times a day (QID) | INTRAMUSCULAR | Status: DC | PRN

## 2012-10-28 MED ORDER — CITALOPRAM HYDROBROMIDE 40 MG PO TABS
40.0000 mg | ORAL_TABLET | Freq: Every day | ORAL | Status: DC
  Administered 2012-10-28 – 2012-10-29 (×2): 40 mg via ORAL
  Filled 2012-10-28 (×2): qty 1

## 2012-10-28 MED ORDER — DIPHENHYDRAMINE HCL 50 MG/ML IJ SOLN
INTRAMUSCULAR | Status: AC
  Filled 2012-10-28: qty 1

## 2012-10-28 MED ORDER — DIPHENHYDRAMINE HCL 50 MG/ML IJ SOLN
25.0000 mg | Freq: Once | INTRAMUSCULAR | Status: AC
  Administered 2012-10-28: 25 mg via INTRAVENOUS

## 2012-10-28 NOTE — ED Notes (Signed)
Pt c/o abscess on buttock, onset wed. Pt went to PMD thurs and was told it was herpes. Pt states abscess is worse.

## 2012-10-28 NOTE — ED Provider Notes (Addendum)
History     CSN: [id number]  Arrival date & time 10/28/12  0158   First MD Initiated Contact with Patient 10/28/12 [phone]      Chief Complaint  Patient presents with  . Abscess    (Consider location/radiation/quality/duration/timing/severity/associated sxs/prior treatment) HPI This is a 36 year old white female with a history of HIV infection. She has had a tender, erythematous lesion on her left buttock for the past 3 days. She was seen in the common infectious disease clinic and was told that the lesion, which was small, open and draining was herpes. It was not cultured. The lesion has subsequently spread, become indurated and tender. There is moderate pain on palpation or movement. She denies systemic symptoms such as fever, chills, nausea or vomiting.  Past Medical History  Diagnosis Date  . HIV (human immunodeficiency virus infection)   . Migraine   . AIDS   . Fibroid   . Thrombocytopenia   . Irregular menses   . Migraine headache   . Asthma   . Cyst, dermoid, arm   . Cellulitis   . Bronchitis     History reviewed. No pertinent past surgical history.  No family history on file.  History  Substance Use Topics  . Smoking status: Current Every Day Smoker -- 0.5 packs/day  . Smokeless tobacco: Never Used  . Alcohol Use: No    OB History    Grav Para Term Preterm Abortions TAB SAB Ect Mult Living                  Review of Systems  All other systems reviewed and are negative.    Allergies  Sulfa antibiotics  Home Medications   Current Outpatient Rx  Name  Route  Sig  Dispense  Refill  . ALBUTEROL SULFATE HFA 108 (90 BASE) MCG/ACT IN AERS   Inhalation   Inhale 2 puffs into the lungs every 6 (six) hours as needed. As needed for shortness of breath   6.7 Inhaler   4   . CETIRIZINE HCL 10 MG PO TABS   Oral   Take 1 tablet (10 mg total) by mouth daily.   30 tablet   5   . CITALOPRAM HYDROBROMIDE 40 MG PO TABS   Oral   Take 1 tablet (40 mg total)  by mouth daily.   30 tablet   11   . CLONAZEPAM 0.5 MG PO TABS   Oral   Take 1 tablet (0.5 mg total) by mouth at bedtime as needed. For sleep   30 tablet   0   . EMTRICITABINE-TENOFOVIR 200-300 MG PO TABS   Oral   Take 1 tablet by mouth daily.   30 tablet   12   . FLUTICASONE PROPIONATE 50 MCG/ACT NA SUSP   Nasal   Place 2 sprays into the nose daily as needed. For allergies         . ONE-DAILY MULTI VITAMINS PO TABS   Oral   Take 1 tablet by mouth daily.         Marland Kitchen RALTEGRAVIR POTASSIUM 400 MG PO TABS   Oral   Take 1 tablet (400 mg total) by mouth 2 (two) times daily.   60 tablet   12   . SODIUM CHLORIDE 0.65 % NA SOLN   Nasal   Place 1 spray into the nose daily as needed. For allergies         . VALACYCLOVIR HCL 1 G PO TABS   Oral  Take 1 tablet (1,000 mg total) by mouth 2 (two) times daily.   14 tablet   1     BP 133/83  Pulse 95  Temp 97.9 F (36.6 C) (Oral)  Resp 18  SpO2 100%  LMP 10/15/2012  Physical Exam General: Well-developed, well-nourished female in no acute distress; appearance consistent with age of record HENT: normocephalic, atraumatic Eyes: pupils equal round and reactive to light; extraocular muscles intact Neck: supple Heart: regular rate and rhythm Lungs: clear to auscultation bilaterally Abdomen: soft; nondistended; nontender Extremities: No deformity; full range of motion Neurologic: Awake, alert and oriented; motor function intact in all extremities and symmetric; no facial droop Skin: Warm and dry; tender, indurated, erythematous region left buttock about 10 cm x 10 cm; no active drainage noted; no pus pocket elicited on bedside ultrasound Psychiatric: Normal mood and affect    ED Course  Procedures (including critical care time)     MDM  The lesion appears to be a cellulitis at this time without a formed pus pockets seen. We will give her vancomycin in the ED and have her admitted.  Nursing notes and vitals signs,  including pulse oximetry, reviewed.  Summary of this visit's results, reviewed by myself:  Labs:  Results for orders placed during the hospital encounter of 10/28/12 (from the past 24 hour(s))  CBC WITH DIFFERENTIAL     Status: Abnormal   Collection Time   10/28/12  4:36 AM      Component Value Range   WBC 9.4  4.0 - 10.5 K/uL   RBC 4.63  3.87 - 5.11 MIL/uL   Hemoglobin 13.5  12.0 - 15.0 g/dL   HCT 40.9  81.1 - 91.4 %   MCV 84.9  78.0 - 100.0 fL   MCH 29.2  26.0 - 34.0 pg   MCHC 34.4  30.0 - 36.0 g/dL   RDW 78.2  95.6 - 21.3 %   Platelets 207  150 - 400 K/uL   Neutrophils Relative 64  43 - 77 %   Neutro Abs 6.0  1.7 - 7.7 K/uL   Lymphocytes Relative 19  12 - 46 %   Lymphs Abs 1.8  0.7 - 4.0 K/uL   Monocytes Relative 7  3 - 12 %   Monocytes Absolute 0.7  0.1 - 1.0 K/uL   Eosinophils Relative 9 (*) 0 - 5 %   Eosinophils Absolute 0.9 (*) 0.0 - 0.7 K/uL   Basophils Relative 0  0 - 1 %   Basophils Absolute 0.0  0.0 - 0.1 K/uL  BASIC METABOLIC PANEL     Status: Normal   Collection Time   10/28/12  4:36 AM      Component Value Range   Sodium 138  135 - 145 mEq/L   Potassium 3.8  3.5 - 5.1 mEq/L   Chloride 105  96 - 112 mEq/L   CO2 24  19 - 32 mEq/L   Glucose, Bld 90  70 - 99 mg/dL   BUN 11  6 - 23 mg/dL   Creatinine, Ser 0.86  0.50 - 1.10 mg/dL   Calcium 8.8  8.4 - 57.8 mg/dL   GFR calc non Af Amer >90  >90 mL/min   GFR calc Af Amer >90  >90 mL/min         Hanley Seamen, MD 10/28/12 0255  Hanley Seamen, MD 10/28/12 0413  Hanley Seamen, MD 10/28/12 [phone]

## 2012-10-28 NOTE — H&P (Addendum)
Hospital Admission Note Date: 10/28/2012  Patient name: Cassandra Rogers Medical record number: [id number] Date of birth: [birth year] Age: 36 y.o. Gender: female PCP: Acey Lav, MD  Medical Service: Internal medicine teaching service--herring  Attending physician:  Dr. Dalphine Handing    1st Contact: Dr. Cicero Duck 2nd Contact: Dr. Everardo Beals    NFAOZ:[id number] After 5 pm or weekends: 1st Contact:      Pager: [phone] 2nd Contact:      Pager: [phone]  Chief Complaint: Left buttock pimple  History of Present Illness:Cassandra Rogers is a 36 year old family with PMH of HIV presented to [facility] with complaints of worsening left buttock pimple.  She claims she first noticed a small bump early last week but did not think much of it.  Over the next few days it started to hurt with even light pressure.  She was seen by Dr. 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  She was prescribed Valtrex at that time but did not get her prescription filled.  Today, she claims she could not even sit down due to the pain and sensitivity with even light clothing.  The lesion is noted to be approximately 10x10 cm in size, central comedone/head of pimple with 1-2cm circumferential induration and surrounding erythema, tender to palpation, erythematous, and warm to touch.  No visible drainage and no pus pocket elicited on bedside ultrasound in ED.  She was given Vancomycin 1g IV and Levaquin 750MG  PO x1 in ED.  She denies any fever, chills, N/V/D, chest pain, shortness of breath, abdominal pain, dysuria, or any other urinary complaints at this time.  She also denies any recent shaving or waxing to the area or trauma.   Meds: Prescriptions prior to admission  Medication Sig Dispense Refill  . albuterol (PROVENTIL HFA;VENTOLIN HFA) 108 (90 BASE) MCG/ACT inhaler Inhale 2 puffs into the lungs every 6 (six) hours as needed. As needed for  shortness of breath  6.7 Inhaler  4  . citalopram (CELEXA) 40 MG tablet Take 1 tablet (40 mg total) by mouth daily.  30 tablet  11  . clonazePAM (KLONOPIN) 0.5 MG tablet Take 1 tablet (0.5 mg total) by mouth at bedtime as needed. For sleep  30 tablet  0  . emtricitabine-tenofovir (TRUVADA) 200-300 MG per tablet Take 1 tablet by mouth daily.  30 tablet  12  . fluticasone (FLONASE) 50 MCG/ACT nasal spray Place 2 sprays into the nose daily as needed. For allergies      . Multiple Vitamin (MULTIVITAMIN) tablet Take 1 tablet by mouth daily.      . raltegravir (ISENTRESS) 400 MG tablet Take 1 tablet (400 mg total) by mouth 2 (two) times daily.  60 tablet  12  . sodium chloride (OCEAN) 0.65 % nasal spray Place 1 spray into the nose daily as needed. For allergies       Allergies: Allergies as of 10/28/2012 - Review Complete 10/28/2012  Allergen Reaction Noted  . Sulfa antibiotics Other (See Comments) 09/19/2011  . Vancomycin Hives 10/28/2012  . Vicodin (hydrocodone-acetaminophen) Itching 10/28/2012   Past Medical History  Diagnosis Date  . HIV (human immunodeficiency virus infection)   . AIDS   . Fibroid   . Thrombocytopenia   . Irregular menses   . Migraine headache   . Asthma   . Cyst, dermoid, arm   . Cellulitis   . Bronchitis   . Depression  Past Surgical History  Procedure Date  . Tubal ligation   . Cesarean section x 2   History reviewed. No pertinent family history. History   Social History  . Marital Status: Widowed    Spouse Name: N/A    Number of Children: N/A  . Years of Education: N/A   Occupational History  . Not on file.   Social History Main Topics  . Smoking status: Current Every Day Smoker -- 0.5 packs/day  . Smokeless tobacco: Never Used  . Alcohol Use: No  . Drug Use: No  . Sexually Active: Not Currently     Comment: pt. declined condoms   Other Topics Concern  . Not on file   Social History Narrative  . No narrative on file   Review of  Systems: Pertinent items are noted in HPI.  Physical Exam: Blood pressure 96/58, pulse 71, temperature 98.7 F (37.1 C), temperature source Oral, resp. rate 18, height 5\' 1"  (1.549 m), weight 118 lb (53.524 kg), last menstrual period 10/15/2012, SpO2 98.00%. Vitals reviewed. General: resting in bed, NAD HEENT: PERRLA, EOMI, no scleral icterus Cardiac: RRR, no rubs, murmurs or gallops Pulm: clear to auscultation bilaterally, no wheezes, rales, or rhonchi Abd: soft, nontender, nondistended, BS present Ext: warm and well perfused, no pedal edema Neuro: alert and oriented X3, cranial nerves II-XII grossly intact, strength and sensation to light touch equal in bilateral upper and lower extremities Skin: Warm and dry.  Left buttock: approximately 10cm circumferential in size, central comedone/head of pimple with 1-2cm circumferential induration and surrounding erythema, tender to palpation, erythematous, and warm to touch.  No active drainage noted;  Lab results: Basic Metabolic Panel:  [facility] 10/28/12 0436  NA 138  K 3.8  CL 105  CO2 24  GLUCOSE 90  BUN 11  CREATININE 0.60  CALCIUM 8.8  MG --  PHOS --   CBC:  Basename 10/28/12 0436  WBC 9.4  NEUTROABS 6.0  HGB 13.5  HCT 39.3  MCV 84.9  PLT 207   Urine Drug Screen: Drugs of Abuse     Component Value Date/Time   LABOPIA NONE DETECTED 04/13/2012 1837   COCAINSCRNUR NONE DETECTED 04/13/2012 1837   LABBENZ NONE DETECTED 04/13/2012 1837   AMPHETMU POSITIVE* 04/13/2012 1837   THCU NONE DETECTED 04/13/2012 1837   LABBARB NONE DETECTED 04/13/2012 1837    Assessment & Plan by Problem: Cassandra Rogers is a 36 year female with PMH of HIV admitted for left buttock cellulitis.     Cellulitis of left buttock--increased size, erythema, and tenderness x1 week.  No pus pocket seen on bedside ultrasound in ED. Given IV vancomycin and PO Levaquin x1 in ED. Afebrile. No leukocytosis.  Margins marked, seems to be improving after antibiotics.  If  worsening, consider I&D if needed.   -admit to med/surg -start clindamycin 300mg  TID for 5-10 days duration -continue to monitor   Human immunodeficiency virus (HIV) disease--last CD4 count 07/2012: 700 with <20 RNA Quant.  On home regimen Truvada and Isentress.  Follows with Dr. Daiva Eves in ID clinic.   -continue home medications   Depression--hx of PTSD s/p husband committing suicide.  On home medications: Celexa and Klonopin.  Follows up with counselor as outpatient.   -continue home meds -continue to monitor   Diet: Regular  Dvt Ppx: Lovenox  Dispo: Admitted for observation.  Disposition is deferred at this time, awaiting improvement of current medical problems. Anticipated discharge in approximately 1-2 day(s).   The patient does not  have a current PCP--usually sees Acey Lav, MD) for ID issues, therefore will not be requiring OPC follow-up after discharge. Will need to arrange new pcp.    The patient does not have transportation limitations that hinder transportation to clinic appointments.  SignedDarden Palmer 10/28/2012, 12:18 PM

## 2012-10-28 NOTE — Progress Notes (Signed)
Brief Nutrition Note:  RD pulled to pt for malnutrition screening tool score of 2. Pt "unsure" if has had any weight loss recently.  Pt states usual weight is 118 lbs, consistent with current weight.  Ate well this morning.   Wt Readings from Last 5 Encounters:  10/28/12 118 lb (53.524 kg)  10/26/12 120 lb (54.432 kg)  09/18/12 119 lb (53.978 kg)  07/27/12 120 lb (54.432 kg)  07/26/12 120 lb 9.6 oz (54.704 kg)  Body mass index is 22.30 kg/(m^2). WNL  Diet: Regular  Chart reviewed, no nutrition interventions warranted at this time. Please consult as needed.   Clarene Duke RD, LDN After Hours/weekend pager [phone]

## 2012-10-29 ENCOUNTER — Other Ambulatory Visit: Payer: Self-pay | Admitting: Infectious Diseases

## 2012-10-29 MED ORDER — CLINDAMYCIN HCL 300 MG PO CAPS: 300.0000 mg | ORAL_CAPSULE | Freq: Three times a day (TID) | ORAL | Status: AC

## 2012-10-29 NOTE — Progress Notes (Signed)
Subjective: Cassandra Rogers was seen and examined this morning.  She was up and walking around and feels much better.  She said she slept very well last night and the pain and swelling around her pimple is much improved.  She is ready to go home.  She denies any fever, chills, N/V/D, chest pain, shortness of breath, abdominal pain, or any urinary complaints at this time.    Objective: Vital signs in last 24 hours: Filed Vitals:   10/28/12 0700 10/28/12 1811 10/28/12 2116 10/29/12 0614  BP: 96/58 118/73 111/68 119/76  Pulse: 71 69 67 83  Temp: 98.7 F (37.1 C) 97.7 F (36.5 C) 98.2 F (36.8 C) 98 F (36.7 C)  TempSrc: Oral Oral Oral Oral  Resp: 18 18 18 18   Height: 5\' 1"  (1.549 m)     Weight: 118 lb (53.524 kg)   119 lb 0.8 oz (54 kg)  SpO2: 98% 97% 99% 98%   Weight change: 1 lb 0.8 oz (0.475 kg)  Intake/Output Summary (Last 24 hours) at 10/29/12 0954 Last data filed at 10/29/12 0900  Gross per 24 hour  Intake   1110 ml  Output    300 ml  Net    810 ml   Vitals reviewed.  General: resting in bed, NAD  HEENT: PERRLA, EOMI, no scleral icterus  Cardiac: RRR, no rubs, murmurs or gallops  Pulm: clear to auscultation bilaterally, no wheezes, rales, or rhonchi  Abd: soft, nontender, nondistended, BS present  Ext: warm and well perfused, no pedal edema  Neuro: alert and oriented X3, cranial nerves II-XII grossly intact, strength and sensation to light touch equal in bilateral upper and lower extremities  Skin: Warm and dry. Left buttock: much improved area of erythema from marked margins, central comedone/head of pimple with 1-2cm circumferential induration, improved tenderness to palpation, erythematous. No active drainage noted.  Lab Results: Basic Metabolic Panel:  Lab 10/28/12 2130  NA 138  K 3.8  CL 105  CO2 24  GLUCOSE 90  BUN 11  CREATININE 0.60  CALCIUM 8.8  MG --  PHOS --   CBC:  Lab 10/28/12 0436  WBC 9.4  NEUTROABS 6.0  HGB 13.5  HCT 39.3  MCV 84.9  PLT 207    Urine Drug Screen: Drugs of Abuse     Component Value Date/Time   LABOPIA NONE DETECTED 04/13/2012 1837   COCAINSCRNUR NONE DETECTED 04/13/2012 1837   LABBENZ NONE DETECTED 04/13/2012 1837   AMPHETMU POSITIVE* 04/13/2012 1837   THCU NONE DETECTED 04/13/2012 1837   LABBARB NONE DETECTED 04/13/2012 1837    No results found. Medications: I have reviewed the patient's current medications. Scheduled Meds:   . citalopram  40 mg Oral Daily  . clindamycin  300 mg Oral Q8H  . emtricitabine-tenofovir  1 tablet Oral Daily  . enoxaparin (LOVENOX) injection  40 mg Subcutaneous Daily  . raltegravir  400 mg Oral BID   Continuous Infusions:  PRN Meds:.acetaminophen, acetaminophen, clonazePAM, ketorolac, ondansetron (ZOFRAN) IV, ondansetron Assessment/Plan: Cassandra Rogers is a 36 year female with PMH of HIV admitted for left buttock cellulitis.   Cellulitis of left buttock--increased size, erythema, and tenderness x1 week on admission. No pus pocket seen on bedside ultrasound in ED. Afebrile. No leukocytosis. Margins marked, improving after antibiotics. If worsening, consider I&D if needed.  -Continue clindamycin 300mg  TID for 5--today day 2 -continue to monitor   Human immunodeficiency virus (HIV) disease--last CD4 count 07/2012: 700 with <20 RNA Quant. On home regimen Truvada and  Isentress. Follows with Dr. Daiva Eves in ID clinic.  -continue home medications   Depression--hx of PTSD s/p husband committing suicide. On home medications: Celexa and Klonopin. Follows up with counselor as outpatient.  -continue home meds  -continue to monitor   Diet: Regular  Dvt Ppx: Lovenox   Dispo: d/c home today  The patient does not have a current PCP--usually sees Acey Lav, MD) for ID issues, therefore will not be requiring OPC follow-up after discharge. Will need to arrange new pcp.  The patient does not have transportation limitations that hinder transportation to clinic appointments.  .Services  Needed at time of discharge: Y = Yes, Blank = No PT:   OT:   RN:   Equipment:   Other:     LOS: 1 day   Darden Palmer 10/29/2012, 9:54 AM

## 2012-10-29 NOTE — Progress Notes (Signed)
Client verbalizes understanding of discharge instructions.  IV site removed.  Area less red, see document assessment for nursing.

## 2012-10-29 NOTE — H&P (Signed)
Internal Medicine Teaching Service Attending Note Date: 10/29/2012  Patient name: Cassandra Rogers  Medical record number: [id number]  Date of birth: November 21, 1975   Chief Complaint: Left buttock bump . History of Present Illness The patient, Cassandra Rogers, is a 36 y.o. year old female with PMH of HIV on treatment, who comes in with the chief complaint of pain in her left hip and a worsening red pimple like bump since last week. I have read the history documented by Dr.Qureshi and I concur with the chronology of events. She saw Dr. Luciana Axe for it but was not happy with the explanation, so she chose to wait on it, however, the pain worsened so she came to the ER. Today, when I met the patient, she was up and about, moving effortlessly in her room. She told me that the pain is much better, and she feels good.   Past Medical History   has a past medical history of HIV (human immunodeficiency virus infection); AIDS; Fibroid; Thrombocytopenia; Irregular menses; Migraine headache; Asthma; Cyst, dermoid, arm; Cellulitis; Bronchitis; and Depression.  Medications  Reviewed  Family History family history is not on file.  Social History  reports that she has been smoking.  She has never used smokeless tobacco. She reports that she does not drink alcohol or use illicit drugs. 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  Review of Systems Positive for - pain left hip. Negative for - fever, chills, bowel or bladder abnormality.  Vital Signs: Filed Vitals:   10/28/12 0700 10/28/12 1811 10/28/12 2116 10/29/12 0614  BP: 96/58 118/73 111/68 119/76  Pulse: 71 69 67 83  Temp: 98.7 F (37.1 C) 97.7 F (36.5 C) 98.2 F (36.8 C) 98 F (36.7 C)  TempSrc: Oral Oral Oral Oral  Resp: 18 18 18 18   Height: 5\' 1"  (1.549 m)     Weight: 118 lb (53.524 kg)   119 lb 0.8 oz (54 kg)  SpO2: 98% 97% 99% 98%    Physical Exam:  I met with patient around 8:30 am today  Vitals reviewed.  General: Standing up all dressed and moving  around effortlessly. Cheerful.  HEENT: PERRL, EOMI, no scleral icterus. Heart: RRR, no rubs, murmurs or gallops. Lungs: Clear to auscultation bilaterally, no wheezes, rales, or rhonchi. Abdomen: Soft, nontender, nondistended, BS present. Extremities: Warm, no pedal edema.  Left Buttock: Lesion in lower quadrant of the inner side of the hip, Erythema about 5 cm in diameter, induration about 3 cm in diameter, small comedone in the center, no discharge, mild tenderness, no fluctuation.  Neuro: Alert and oriented X3, cranial nerves II-XII grossly intact,  strength and sensation to light touch equal in bilateral upper and lower extremities  Lab results: CMP     Component Value Date/Time   NA 138 10/28/2012 0436   K 3.8 10/28/2012 0436   CL 105 10/28/2012 0436   CO2 24 10/28/2012 0436   GLUCOSE 90 10/28/2012 0436   BUN 11 10/28/2012 0436   CREATININE 0.60 10/28/2012 0436   CREATININE 0.81 08/08/2012 1416   CALCIUM 8.8 10/28/2012 0436   PROT 7.2 08/08/2012 1416   ALBUMIN 4.8 08/08/2012 1416   AST 18 08/08/2012 1416   ALT 15 08/08/2012 1416   ALKPHOS 48 08/08/2012 1416   BILITOT 0.3 08/08/2012 1416   GFRNONAA >90 10/28/2012 0436   GFRAA >90 10/28/2012 0436   CBC    Component Value Date/Time   WBC 9.4 10/28/2012 0436   RBC 4.63 10/28/2012 0436  HGB 13.5 10/28/2012 0436   HCT 39.3 10/28/2012 0436   PLT 207 10/28/2012 0436   MCV 84.9 10/28/2012 0436   MCH 29.2 10/28/2012 0436   MCHC 34.4 10/28/2012 0436   RDW 14.9 10/28/2012 0436   LYMPHSABS 1.8 10/28/2012 0436   MONOABS 0.7 10/28/2012 0436   EOSABS 0.9* 10/28/2012 0436   BASOSABS 0.0 10/28/2012 0436    No results found for this basename: TROPONINI:5 in the last 168 hours No results found for this basename: PROBNP:5 in the last 168 hours  Urinalysis    Component Value Date/Time   COLORURINE YELLOW 04/13/2012 1837   APPEARANCEUR CLEAR 04/13/2012 1837   LABSPEC 1.014 04/13/2012 1837   PHURINE 5.5 04/13/2012 1837   GLUCOSEU  NEGATIVE 04/13/2012 1837   HGBUR NEGATIVE 04/13/2012 1837   BILIRUBINUR neg 07/26/2012 1056   BILIRUBINUR NEGATIVE 04/13/2012 1837   KETONESUR 15* 04/13/2012 1837   PROTEINUR NEGATIVE 04/13/2012 1837   UROBILINOGEN 0.2 07/26/2012 1056   UROBILINOGEN 0.2 04/13/2012 1837   NITRITE neg 07/26/2012 1056   NITRITE NEGATIVE 04/13/2012 1837   LEUKOCYTESUR Negative 07/26/2012 1056   Assessment and Plan: Cellulitis on the left buttock subcutaneous tissue: The lesion has improved considerably. There are no systemic symptoms. There is no limitation of movement around the joint. An ultrasound done in ED showed no underlying abscess. I agree with Clindamycin coverage as that would cover susceptible bacterial infections, mainly those caused by anaerobes, streptococci, pneumococci, and staphylococci. I think that at this point, she can be treated as outpatient and she could follow up with her PCP or ID physician.   Other chronic issues per resident note.   Thanks, Aletta Edouard, MD 12/15/201310:46 AM

## 2012-10-29 NOTE — Discharge Summary (Signed)
Internal Medicine Teaching [facility] Discharge Note  Name: Cassandra Rogers MRN: [id number] DOB: [birth year] 36 y.o.  Date of Admission: 10/28/2012  2:03 AM Date of Discharge: 10/29/2012 Attending Physician: Aletta Edouard, MD  Discharge Diagnosis: Principal Problem:  *Cellulitis of left buttock Active Problems:  Human immunodeficiency virus (HIV) disease  Depression  Discharge Medications:   Medication List     As of 10/29/2012 10:32 AM    TAKE these medications         albuterol 108 (90 BASE) MCG/ACT inhaler   Commonly known as: PROVENTIL HFA;VENTOLIN HFA   Inhale 2 puffs into the lungs every 6 (six) hours as needed. As needed for shortness of breath      citalopram 40 MG tablet   Commonly known as: CELEXA   Take 1 tablet (40 mg total) by mouth daily.      clindamycin 300 MG capsule   Commonly known as: CLEOCIN   Take 1 capsule (300 mg total) by mouth 3 (three) times daily. On day 1, 12/15, take only 2 doses since first dose given before discharge.      clonazePAM 0.5 MG tablet   Commonly known as: KLONOPIN   Take 1 tablet (0.5 mg total) by mouth at bedtime as needed. For sleep      emtricitabine-tenofovir 200-300 MG per tablet   Commonly known as: TRUVADA   Take 1 tablet by mouth daily.      fluticasone 50 MCG/ACT nasal spray   Commonly known as: FLONASE   Place 2 sprays into the nose daily as needed. For allergies      multivitamin tablet   Take 1 tablet by mouth daily.      raltegravir 400 MG tablet   Commonly known as: ISENTRESS   Take 1 tablet (400 mg total) by mouth 2 (two) times daily.      sodium chloride 0.65 % nasal spray   Commonly known as: OCEAN   Place 1 spray into the nose daily as needed. For allergies       Disposition and follow-up:   Ms.Cassandra Rogers was discharged from [facility] in stable condition.  At the hospital follow up visit please address: L buttock cellulitis: discharged on 5 day course of clindamycin.     Follow-up Appointments:     Follow-up Information    Schedule an appointment as soon as possible for a visit with Acey Lav, MD. (in one week, after completeing abx)    Contact information:   [address] [place] [address] [phone]         Discharge Orders    Future Appointments: Provider: Department: Dept Phone: Center:   12/20/2012 9:30 AM [facility] [phone] [facility]   01/03/2013 9:30 AM Randall Hiss, MD [facility] for [facility] [phone] [facility]     Future Orders Please Complete By Expires   Diet - low sodium heart healthy      Increase activity slowly      Call MD for:  temperature >100.4      Call MD for:  severe uncontrolled pain      Call MD for:  redness, tenderness, or signs of infection (pain, swelling, redness, odor or green/yellow discharge around incision site)        Admission HPI: Mr. Shaheed is a 36 year old family with PMH of HIV presented to [facility]  with complaints of worsening left buttock pimple. She claims she first noticed a small bump early last week but did not think much of it. Over the next few days it started to hurt with even light pressure. She was seen by Dr. 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 She was prescribed Valtrex at that time but did not get her prescription filled. Today, she claims she could not even sit down due to the pain and sensitivity with even light clothing. The lesion is noted to be approximately 10x10 cm in size, central comedone/head of pimple with 1-2cm circumferential induration and surrounding erythema, tender to palpation, erythematous, and warm to touch. No visible drainage and no pus pocket elicited on bedside ultrasound in ED. She was given Vancomycin 1g IV and Levaquin 750MG  PO x1 in ED. She denies any fever,  chills, N/V/D, chest pain, shortness of breath, abdominal pain, dysuria, or any other urinary complaints at this time. She also denies any recent shaving or waxing to the area or trauma  Hospital Course by problem list:  Cellulitis of left buttock--presented with complaints of pain, warmth, and erythema "pimple" on left buttock increasing in size x1 week.  She was initially given 1 dose of Vancomycin and Levaquin in ED and then transitioned to oral Clindamycin which she will need to take for 5 days upon discharge.  Cellulitis margins were marked day of admission and much improved on discharge.  She is able to walk around, reports significant improvement in pain and discomfort and swelling.  Will need to follow up with pcp as outpatient.    Human immunodeficiency virus (HIV) disease--last CD4 count 07/2012: 700 with <20 RNA Quant. On home regimen Truvada and Isentress that were continued during hospital course and on discharge. Follows with Dr. Daiva Eves in ID clinic and will continue to do so upon discharge.     Depression--hx of PTSD s/p husband committing suicide. On home medications: Celexa and Klonopin. Follows up with counselor as outpatient. Home medications continued during hospital course and on discharge.  F/u with counselor and pcp.    Discharge Vitals:  BP 119/76  Pulse 83  Temp 98 F (36.7 C) (Oral)  Resp 18  Ht 5\' 1"  (1.549 m)  Wt 119 lb 0.8 oz (54 kg)  BMI 22.49 kg/m2  SpO2 98%  LMP 10/15/2012  Discharge Labs:  Results for orders placed during the hospital encounter of 10/28/12 (from the past 24 hour(s))  PREGNANCY, URINE     Status: Normal   Collection Time   10/28/12 12:18 PM      Component Value Range   Preg Test, Ur NEGATIVE  NEGATIVE   Signed: Darden Palmer 10/29/2012, 10:32 AM   Time Spent on Discharge: 35 minutes  Services Ordered on Discharge: none Equipment Ordered on Discharge: none

## 2012-10-30 ENCOUNTER — Other Ambulatory Visit: Payer: Self-pay | Admitting: *Deleted

## 2012-10-30 DIAGNOSIS — F419 Anxiety disorder, unspecified: Secondary | ICD-10-CM

## 2012-10-30 MED ORDER — CLONAZEPAM 0.5 MG PO TABS: 0.5000 mg | ORAL_TABLET | Freq: Every evening | ORAL | Status: DC | PRN

## 2012-10-30 NOTE — Discharge Summary (Signed)
Internal Medicine Teaching Service Attending Note Date: 10/30/2012  Patient name: Cassandra Rogers  Medical record number: [id number]  Date of birth: Dec 20, 1975    I evaluated the patient on the day of discharge and discussed the discharge plan with my resident team. I agree with the discharge documentation and disposition.  Thank you for working with me on this patient's care.   Thanks Aletta Edouard 10/30/2012, 12:33 AM

## 2012-10-30 NOTE — H&P (Signed)
I agree with the documentation. Please see my co-signed note from before. Thanks.

## 2012-11-07 ENCOUNTER — Encounter: Payer: Self-pay | Admitting: Infectious Diseases

## 2012-11-07 ENCOUNTER — Ambulatory Visit (INDEPENDENT_AMBULATORY_CARE_PROVIDER_SITE_OTHER): Payer: Self-pay | Admitting: Infectious Diseases

## 2012-11-07 VITALS — BP 131/81 | HR 76 | Temp 98.1°F | Wt 122.0 lb

## 2012-11-07 DIAGNOSIS — L03317 Cellulitis of buttock: Secondary | ICD-10-CM

## 2012-11-07 DIAGNOSIS — L0231 Cutaneous abscess of buttock: Secondary | ICD-10-CM

## 2012-11-07 NOTE — Assessment & Plan Note (Signed)
She has improved. I did not see her lesion previously but currently it has the feel of a healed lesion with some evidence of scar induration/underneath. I asked her to RTC if this recurs.  Her wound was examined with RN in room.  She is offered/refuses condoms.  She will rtc as previously scheduled.

## 2012-11-07 NOTE — Progress Notes (Signed)
  Subjective:    Patient ID: Cassandra Rogers, female    DOB: 03-20-1976, 36 y.o.   MRN: [id number]  HPI 36 yo F with hx of HIV+ since 36 yo. She is currently maintained on ISN/TRV. 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  Was seen last week after she had a lesion on her L buttock. She noted it draining and it unroofed it self. It became hot and tender.  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 Didn't take valtrex she was rx.  She was seen in hospital 12-14, started on vanco. Remembers it was 10 x 10 cm.  Was admitted to [facility], started on clinda and levaquin. She was d/c home on 10-30-12 and took shower, wound began to drain massively. She helped express the remaining d/c, washed with hibiclens and put bacitracin on it.  Now is improving. Has no further pain or swelling. No fever or chills. Finished clinda 11-05-12.   Has gained 7#. Sinuses even feel better.   HIV 1 RNA Quant (copies/mL)  Date Value  08/08/2012 <20   02/09/2012 <20   11/24/2011 <20      CD4 T Cell Abs (cmm)  Date Value  08/08/2012 700   02/09/2012 520   11/24/2011 490      Review of Systems     Objective:   Physical Exam  Constitutional: She appears well-developed and well-nourished.  Genitourinary:             Assessment & Plan:

## 2012-11-14 ENCOUNTER — Other Ambulatory Visit: Payer: Self-pay | Admitting: Licensed Clinical Social Worker

## 2012-11-16 ENCOUNTER — Telehealth: Payer: Self-pay | Admitting: *Deleted

## 2012-11-16 DIAGNOSIS — B379 Candidiasis, unspecified: Secondary | ICD-10-CM

## 2012-11-16 MED ORDER — FLUCONAZOLE 100 MG PO TABS: 100.0000 mg | ORAL_TABLET | Freq: Every day | ORAL | Status: AC

## 2012-11-16 NOTE — Telephone Encounter (Signed)
Ok to refill diflucan 

## 2012-12-20 ENCOUNTER — Other Ambulatory Visit: Payer: Self-pay

## 2013-01-03 ENCOUNTER — Ambulatory Visit: Payer: Self-pay | Admitting: Infectious Disease

## 2013-02-15 ENCOUNTER — Encounter: Payer: Self-pay | Admitting: *Deleted

## 2013-02-22 ENCOUNTER — Telehealth: Payer: Self-pay | Admitting: *Deleted

## 2013-02-22 NOTE — Telephone Encounter (Signed)
Message left requesting pt call to schedule annual PAP smear visit.

## 2013-03-04 IMAGING — CR DG FOOT COMPLETE 3+V*R*
3 series · 3 of 3 positions shown · non-contrast
Comparison: None.

CLINICAL DATA: Glass injury to right foot with laceration adjacent
to the first metatarsal.

RIGHT FOOT COMPLETE - 3+ VIEW

[t foot ap right]
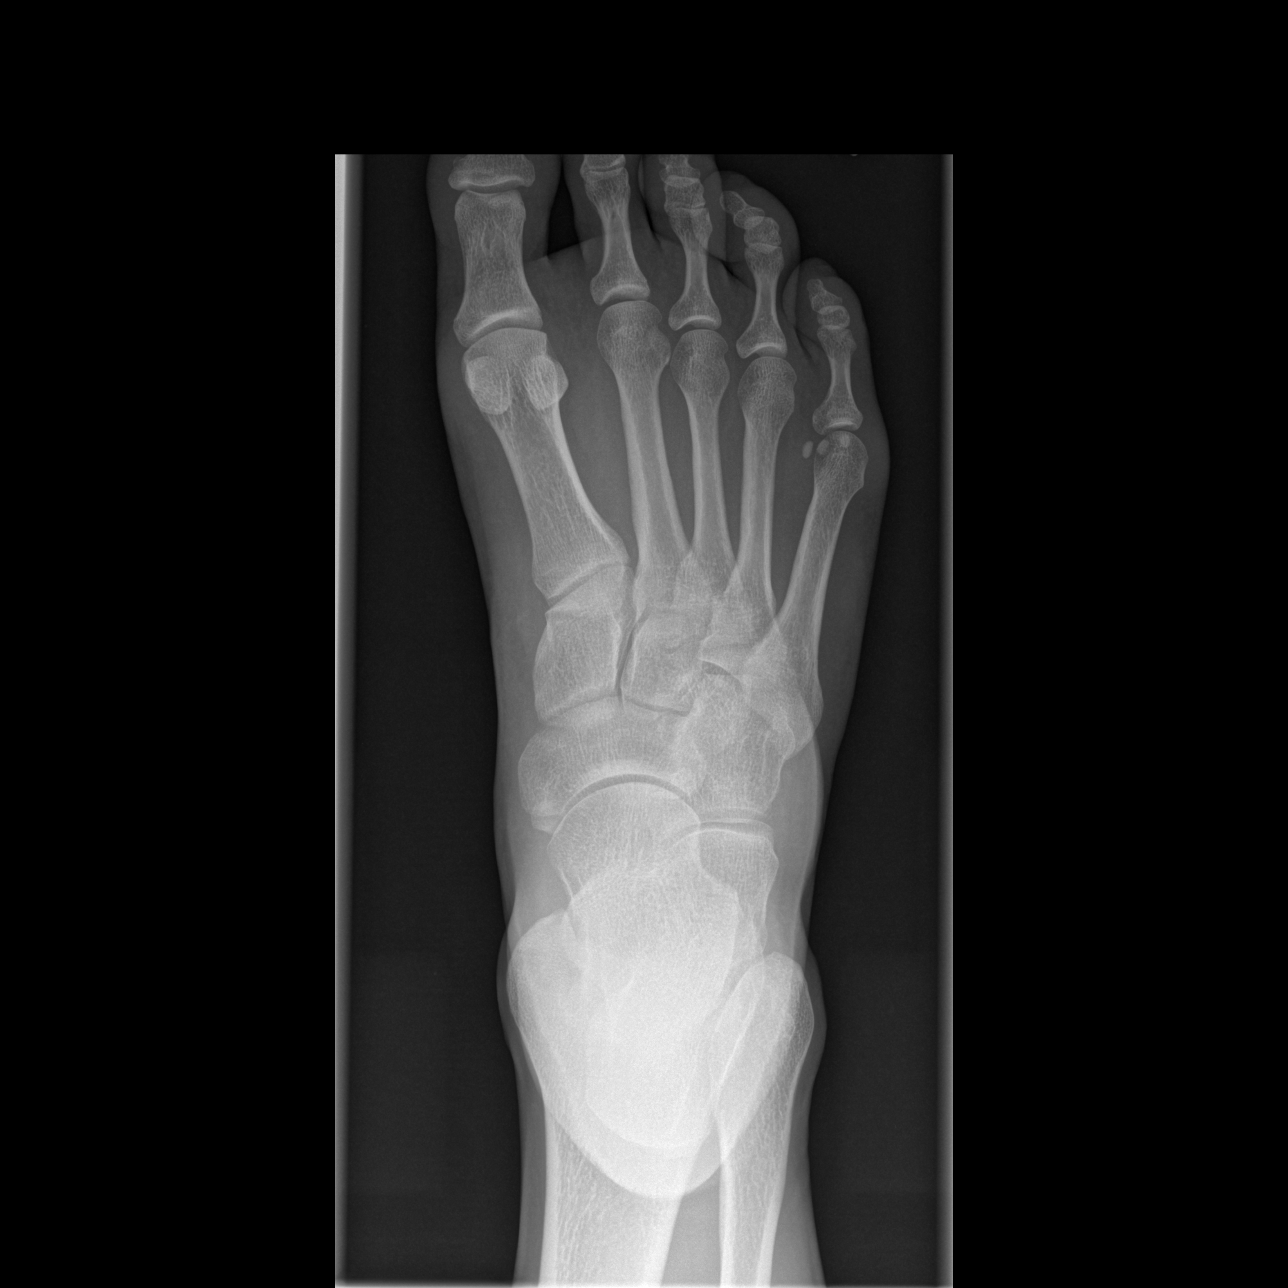

[t foot oblique right]
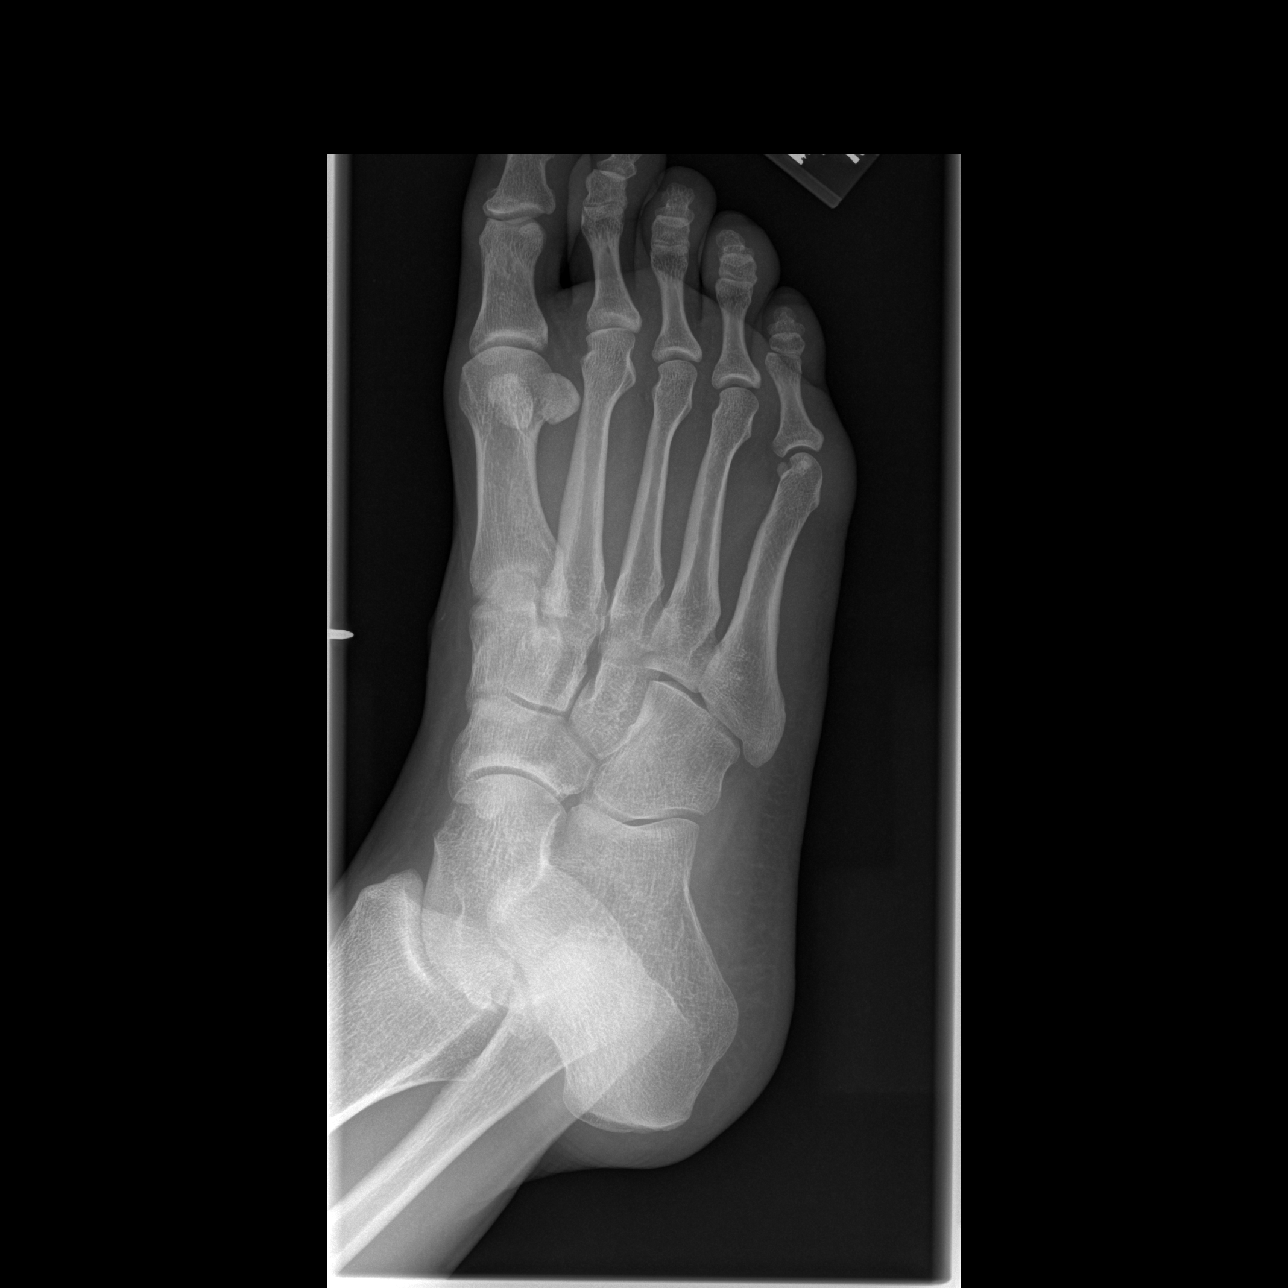

[t foot lat right]
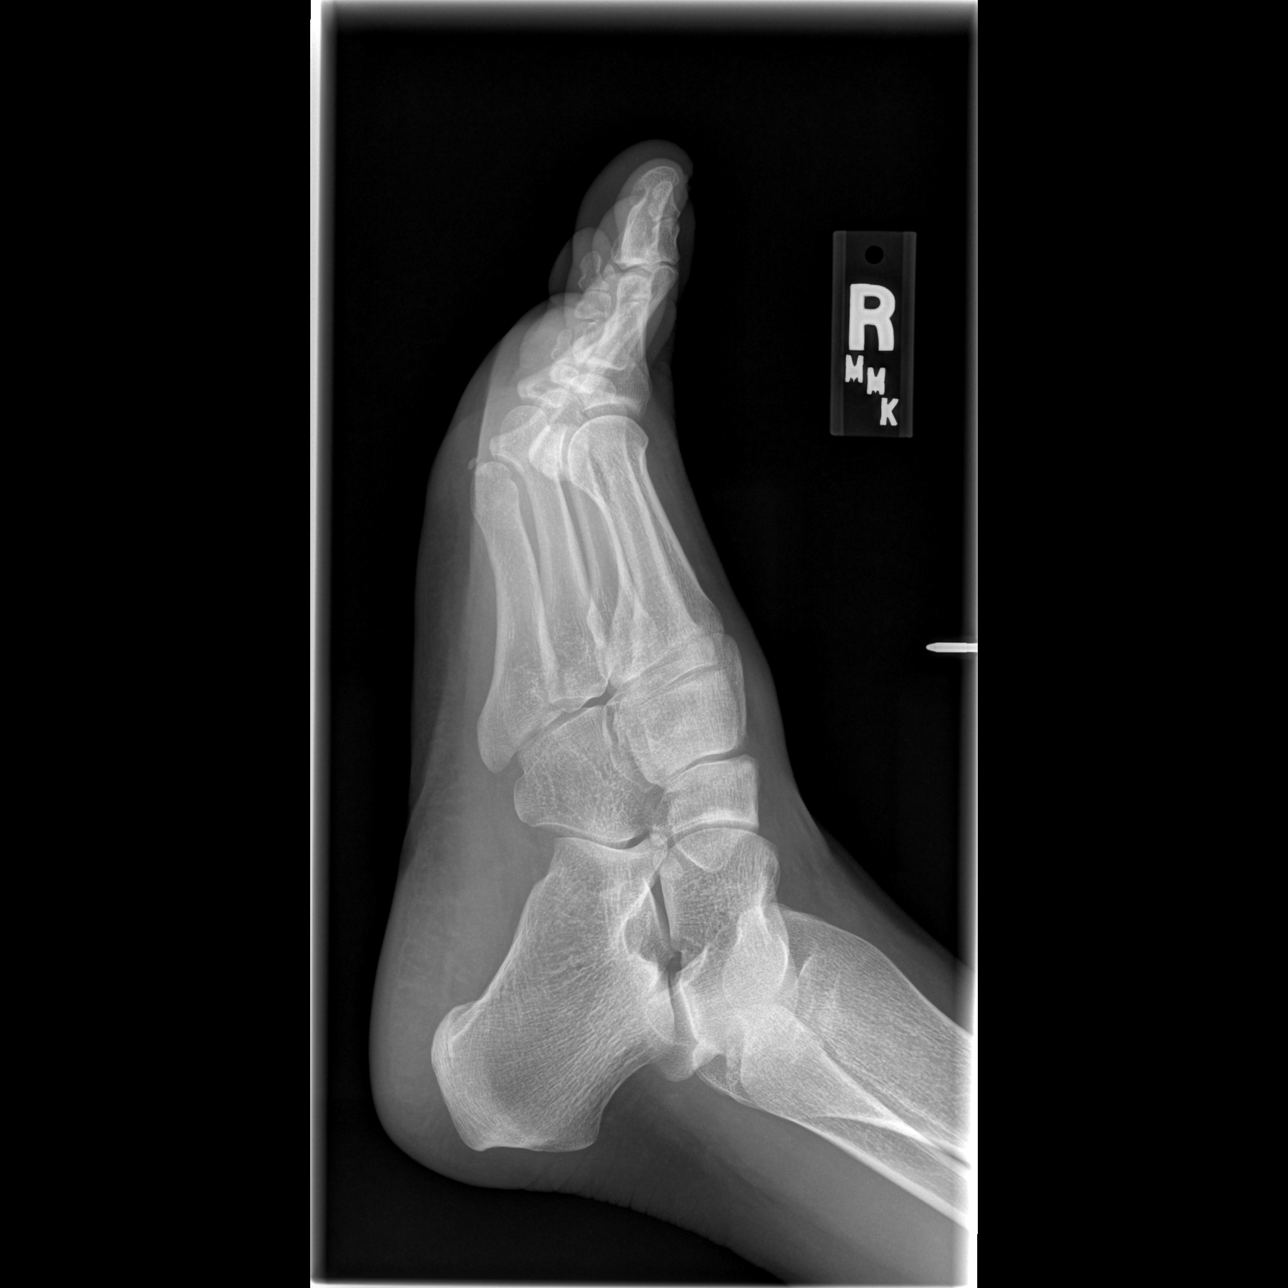

[3 of 3 positions shown; findings below may reference images not displayed]

FINDINGS: No fracture identified.  On the oblique view, there is
suggestion of a subtle linear density overlying superficial soft
tissues and adjacent to the medial cuneiform.  This is not
confirmed on other views.  This may represent subtle soft tissue
foreign body or density on the skin.
IMPRESSION: Subtle linear density medial to the base of the first metatarsal
and medial cuneiform.  This is not seen in other projections and
may represent subtle foreign body or density on the patient's skin.

## 2013-05-02 ENCOUNTER — Ambulatory Visit: Payer: Self-pay

## 2013-05-07 ENCOUNTER — Ambulatory Visit: Payer: Self-pay

## 2013-05-14 ENCOUNTER — Ambulatory Visit: Payer: Self-pay

## 2013-05-15 NOTE — Progress Notes (Signed)
Patient ID: Cassandra Rogers, female   DOB: Sep 06, 1976, 37 y.o.   MRN: [id number] Received a voicemail on 05/14/13 from patient stating that she wanted to cancel her appointments here within the clinic.  She was upset because she was told on 05/07/13 (while I was on vacation) that she needed to reschedule her PAP appointment due to not having proper documentation for [employer] eligibility paperwork.  She stated she wanted to go to [facility] and to cancel her future appointments.  I tried contacting patient on 05/15/13 but there was no answer and I was unable to leave a message/JLH

## 2013-05-16 ENCOUNTER — Ambulatory Visit: Payer: Self-pay

## 2013-05-30 ENCOUNTER — Other Ambulatory Visit: Payer: Self-pay

## 2013-06-14 ENCOUNTER — Ambulatory Visit: Payer: Self-pay | Admitting: Infectious Disease

## 2013-06-28 ENCOUNTER — Encounter: Payer: Self-pay | Admitting: Licensed Clinical Social Worker

## 2013-09-04 IMAGING — CR DG FOOT COMPLETE 3+V*L*
2 series · 2 of 2 positions shown · non-contrast
Comparison: None.

CLINICAL DATA: Trauma, swelling over dorsal central metatarsals.
Injury.

LEFT FOOT - COMPLETE 3+ VIEW

[AP]
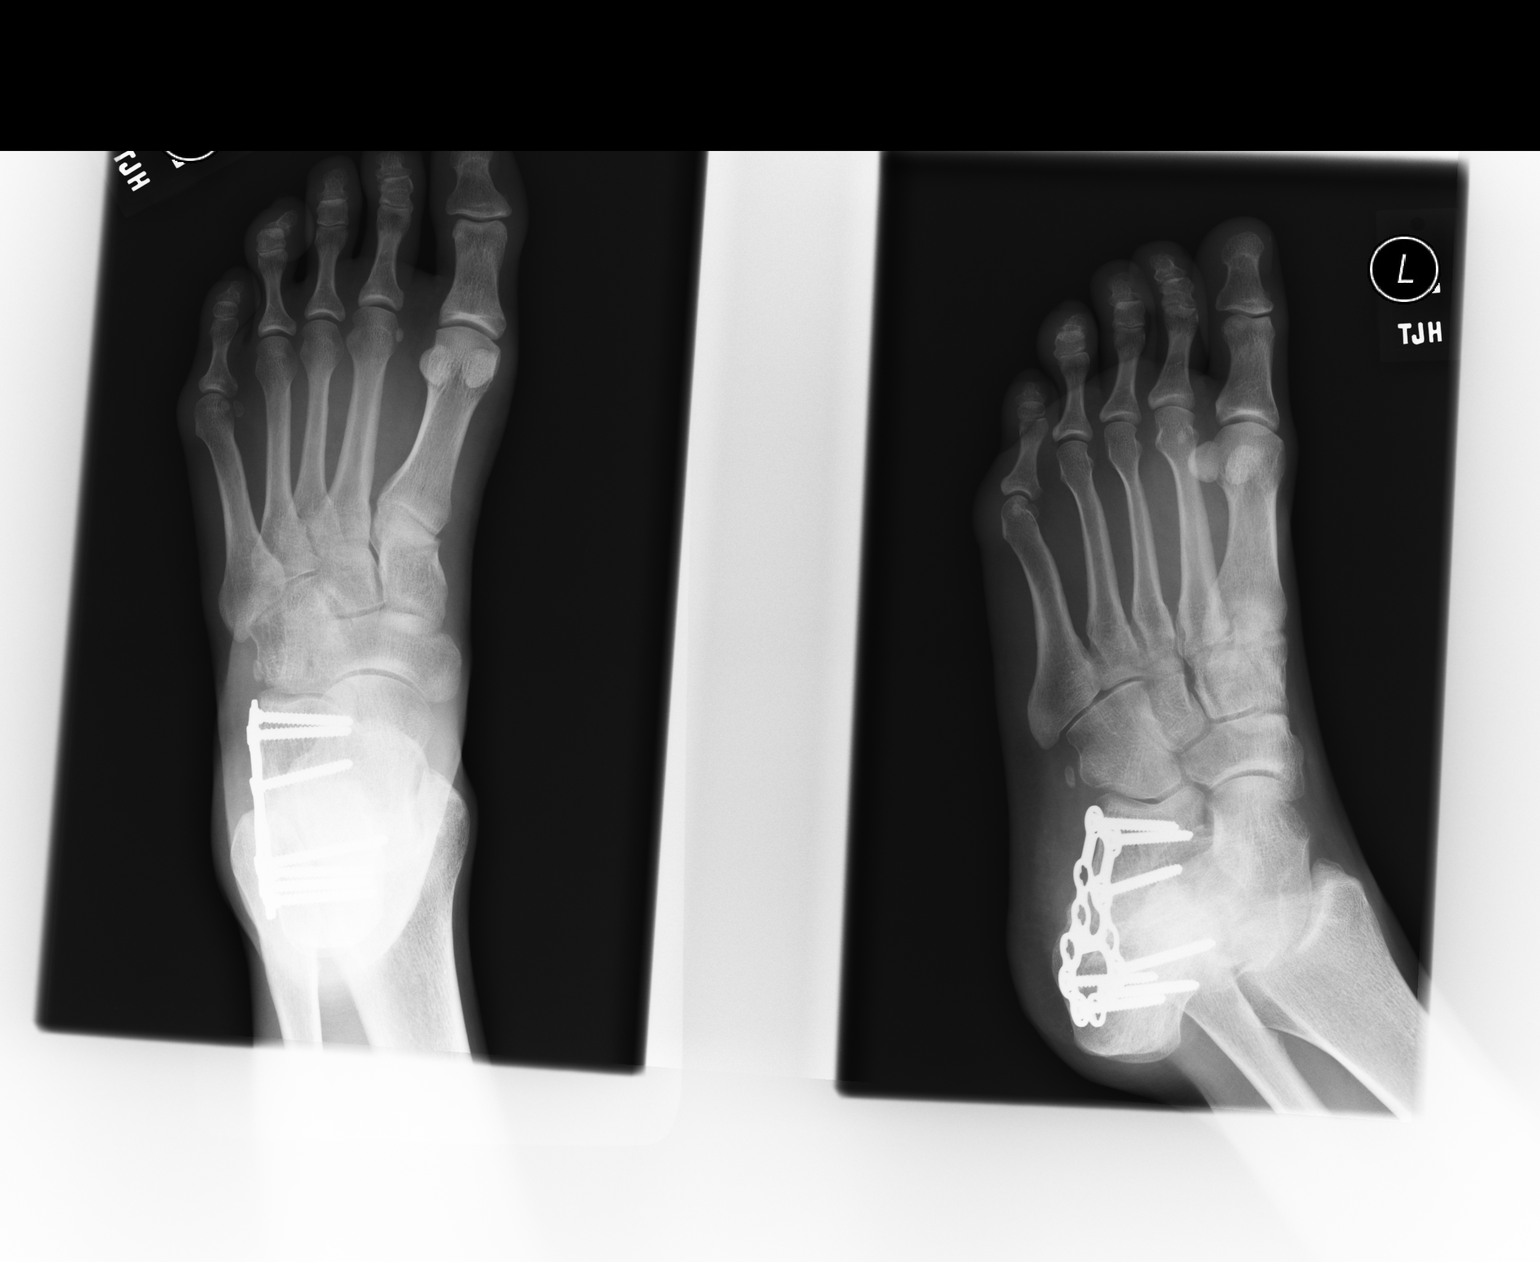

[ap obl int rot]
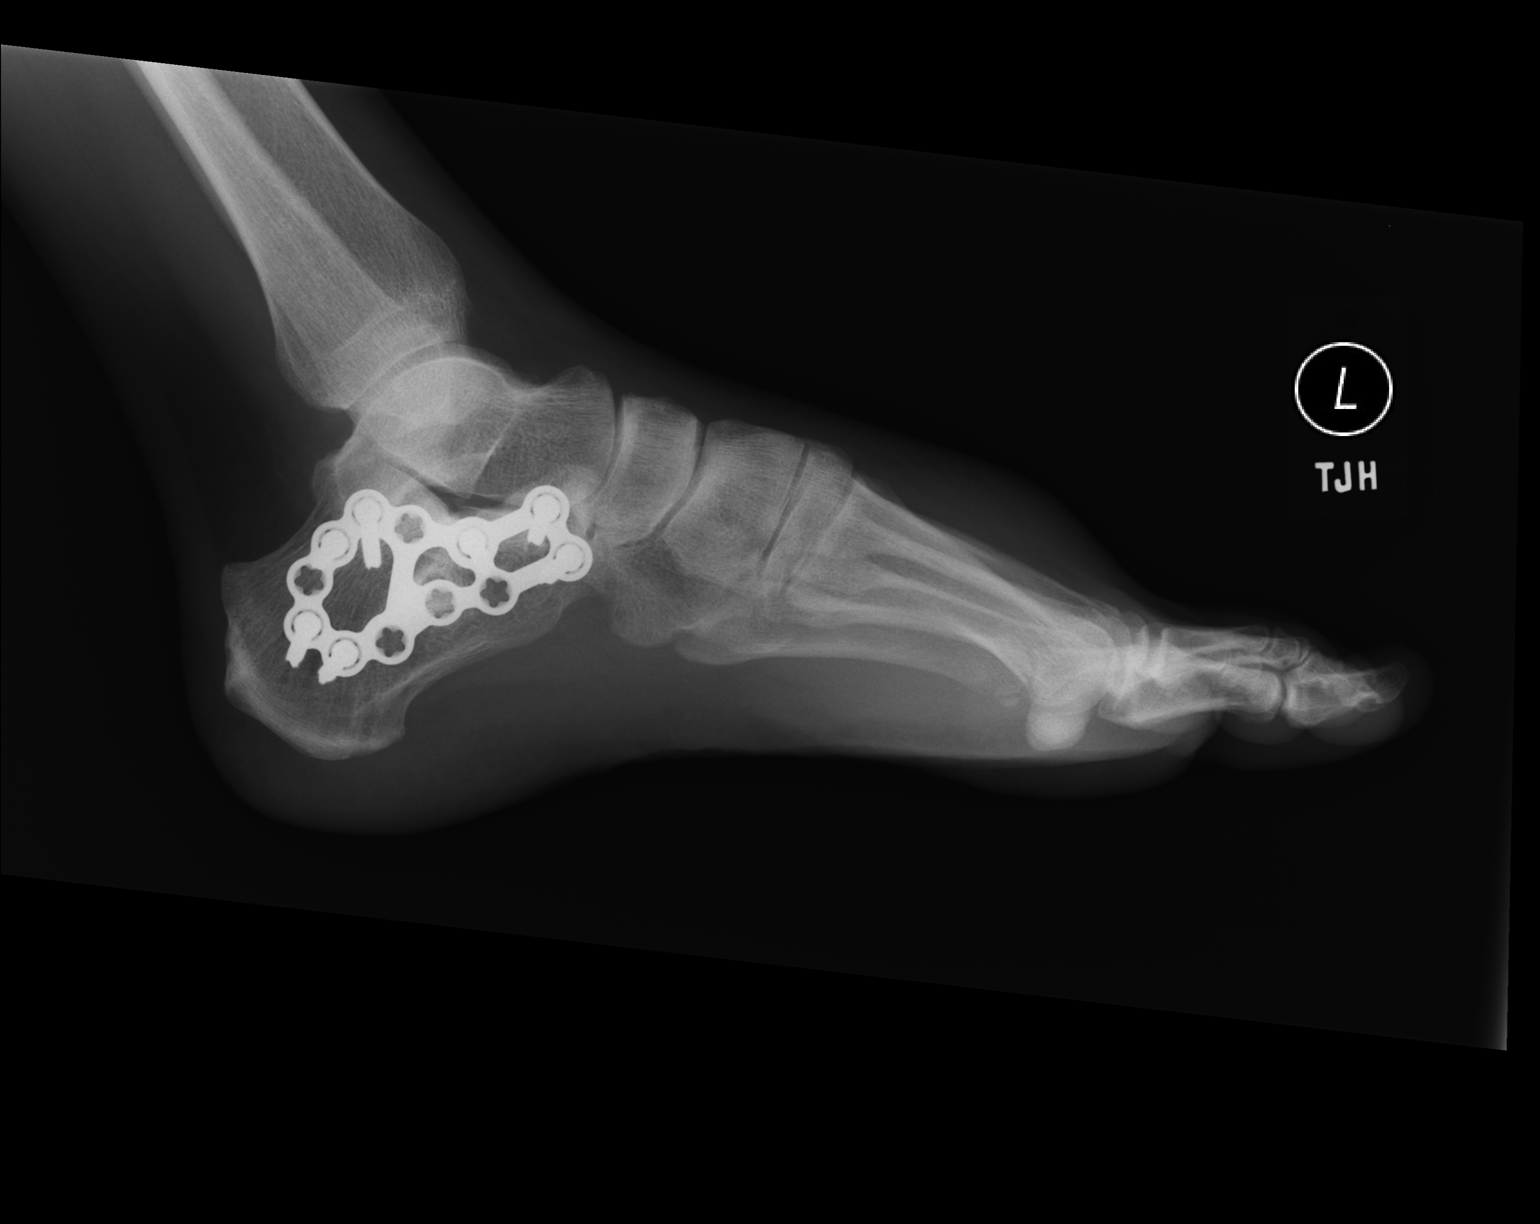

[2 of 2 positions shown; findings below may reference images not displayed]

FINDINGS: There is significant soft tissue swelling along the
metatarsals.  No evidence for acute fracture or subluxation.  Prior
ORIF of the calcaneus.
IMPRESSION: Significant soft tissue swelling along the forefoot.

Clinically significant discrepancy from primary report, if
provided: None

## 2013-09-30 ENCOUNTER — Encounter (HOSPITAL_BASED_OUTPATIENT_CLINIC_OR_DEPARTMENT_OTHER): Payer: Self-pay | Admitting: Emergency Medicine

## 2013-09-30 ENCOUNTER — Emergency Department (HOSPITAL_BASED_OUTPATIENT_CLINIC_OR_DEPARTMENT_OTHER)
Admission: EM | Admit: 2013-09-30 | Discharge: 2013-09-30 | Disposition: A | Discharge status: Home / Self Care | Payer: Self-pay | Attending: Emergency Medicine | Admitting: Emergency Medicine

## 2013-09-30 DIAGNOSIS — Z8742 Personal history of other diseases of the female genital tract: Secondary | ICD-10-CM | POA: Insufficient documentation

## 2013-09-30 DIAGNOSIS — Z872 Personal history of diseases of the skin and subcutaneous tissue: Secondary | ICD-10-CM | POA: Insufficient documentation

## 2013-09-30 DIAGNOSIS — F3289 Other specified depressive episodes: Secondary | ICD-10-CM | POA: Insufficient documentation

## 2013-09-30 DIAGNOSIS — F17200 Nicotine dependence, unspecified, uncomplicated: Secondary | ICD-10-CM | POA: Insufficient documentation

## 2013-09-30 DIAGNOSIS — J45901 Unspecified asthma with (acute) exacerbation: Secondary | ICD-10-CM | POA: Insufficient documentation

## 2013-09-30 DIAGNOSIS — J22 Unspecified acute lower respiratory infection: Secondary | ICD-10-CM

## 2013-09-30 DIAGNOSIS — Z862 Personal history of diseases of the blood and blood-forming organs and certain disorders involving the immune mechanism: Secondary | ICD-10-CM | POA: Insufficient documentation

## 2013-09-30 DIAGNOSIS — F329 Major depressive disorder, single episode, unspecified: Secondary | ICD-10-CM | POA: Insufficient documentation

## 2013-09-30 DIAGNOSIS — Z21 Asymptomatic human immunodeficiency virus [HIV] infection status: Secondary | ICD-10-CM | POA: Insufficient documentation

## 2013-09-30 DIAGNOSIS — IMO0002 Reserved for concepts with insufficient information to code with codable children: Secondary | ICD-10-CM | POA: Insufficient documentation

## 2013-09-30 DIAGNOSIS — Z79899 Other long term (current) drug therapy: Secondary | ICD-10-CM | POA: Insufficient documentation

## 2013-09-30 DIAGNOSIS — G43909 Migraine, unspecified, not intractable, without status migrainosus: Secondary | ICD-10-CM

## 2013-09-30 DIAGNOSIS — J069 Acute upper respiratory infection, unspecified: Secondary | ICD-10-CM | POA: Insufficient documentation

## 2013-09-30 MED ORDER — DIPHENHYDRAMINE HCL 50 MG/ML IJ SOLN
25.0000 mg | Freq: Once | INTRAMUSCULAR | Status: AC
  Administered 2013-09-30: 25 mg via INTRAVENOUS
  Filled 2013-09-30: qty 1

## 2013-09-30 MED ORDER — SUMATRIPTAN SUCCINATE 6 MG/0.5ML ~~LOC~~ SOLN
6.0000 mg | Freq: Once | SUBCUTANEOUS | Status: AC
  Administered 2013-09-30: 6 mg via SUBCUTANEOUS
  Filled 2013-09-30: qty 0.5

## 2013-09-30 MED ORDER — SODIUM CHLORIDE 0.9 % IV BOLUS (SEPSIS)
1000.0000 mL | Freq: Once | INTRAVENOUS | Status: AC
  Administered 2013-09-30: 1000 mL via INTRAVENOUS

## 2013-09-30 MED ORDER — DOXYCYCLINE HYCLATE 100 MG PO CAPS: 100.0000 mg | ORAL_CAPSULE | Freq: Two times a day (BID) | ORAL | Status: DC

## 2013-09-30 MED ORDER — KETOROLAC TROMETHAMINE 30 MG/ML IJ SOLN
30.0000 mg | Freq: Once | INTRAMUSCULAR | Status: AC
  Administered 2013-09-30: 30 mg via INTRAVENOUS
  Filled 2013-09-30: qty 1

## 2013-09-30 MED ORDER — METOCLOPRAMIDE HCL 5 MG/ML IJ SOLN
10.0000 mg | Freq: Once | INTRAMUSCULAR | Status: AC
  Administered 2013-09-30: 10 mg via INTRAVENOUS
  Filled 2013-09-30: qty 2

## 2013-09-30 MED ORDER — IPRATROPIUM BROMIDE 0.02 % IN SOLN
0.5000 mg | Freq: Once | RESPIRATORY_TRACT | Status: AC
  Administered 2013-09-30: 0.5 mg via RESPIRATORY_TRACT
  Filled 2013-09-30: qty 2.5

## 2013-09-30 MED ORDER — ALBUTEROL SULFATE (5 MG/ML) 0.5% IN NEBU
5.0000 mg | INHALATION_SOLUTION | Freq: Once | RESPIRATORY_TRACT | Status: AC
  Administered 2013-09-30: 5 mg via RESPIRATORY_TRACT
  Filled 2013-09-30: qty 1

## 2013-09-30 NOTE — ED Notes (Addendum)
headache all day Saturday that progressively worsen to 10/10 now Also reports photophobia and nausea and sinus congestion

## 2013-09-30 NOTE — ED Provider Notes (Signed)
CSN: [id number]     Arrival date & time 09/30/13  0441 History   First MD Initiated Contact with Patient 09/30/13 [phone]     Chief Complaint  Patient presents with  . Migraine   (Consider location/radiation/quality/duration/timing/severity/associated sxs/prior Treatment) HPI This is a 37 year old female with a history of migraines, HIV infection and asthma. She has been sick for several days with a cold; she has had cough, wheezing and sinus congestion. She's been taking over-the-counter medications for this. She is here this morning with a headache that began yesterday. The onset was gradual. It is characterized as like previous migraines and is located generally in the head. She describes it as pressure-like. It has been associated with photophobia, nausea and retching. She describes the pain as severe.  Past Medical History  Diagnosis Date  . HIV (human immunodeficiency virus infection)   . AIDS   . Fibroid   . Thrombocytopenia   . Irregular menses   . Migraine headache   . Asthma   . Cyst, dermoid, arm   . Cellulitis   . Bronchitis   . Depression    Past Surgical History  Procedure Laterality Date  . Tubal ligation    . Cesarean section  x 2   History reviewed. No pertinent family history. History  Substance Use Topics  . Smoking status: Current Every Day Smoker -- 0.50 packs/day  . Smokeless tobacco: Never Used  . Alcohol Use: No   OB History   Grav Para Term Preterm Abortions TAB SAB Ect Mult Living                 Review of Systems  All other systems reviewed and are negative.    Allergies  Sulfa antibiotics; Vancomycin; and Vicodin  Home Medications   Current Outpatient Rx  Name  Route  Sig  Dispense  Refill  . albuterol (PROVENTIL HFA;VENTOLIN HFA) 108 (90 BASE) MCG/ACT inhaler   Inhalation   Inhale 2 puffs into the lungs every 6 (six) hours as needed. As needed for shortness of breath   6.7 Inhaler   4   . EXPIRED: citalopram (CELEXA) 40 MG  tablet   Oral   Take 1 tablet (40 mg total) by mouth daily.   30 tablet   11   . clonazePAM (KLONOPIN) 0.5 MG tablet   Oral   Take 1 tablet (0.5 mg total) by mouth at bedtime as needed. For sleep   30 tablet   0   . emtricitabine-tenofovir (TRUVADA) 200-300 MG per tablet   Oral   Take 1 tablet by mouth daily.   30 tablet   12   . fluticasone (FLONASE) 50 MCG/ACT nasal spray   Nasal   Place 2 sprays into the nose daily as needed. For allergies         . Multiple Vitamin (MULTIVITAMIN) tablet   Oral   Take 1 tablet by mouth daily.         . raltegravir (ISENTRESS) 400 MG tablet   Oral   Take 1 tablet (400 mg total) by mouth 2 (two) times daily.   60 tablet   12   . sodium chloride (OCEAN) 0.65 % nasal spray   Nasal   Place 1 spray into the nose daily as needed. For allergies          BP 153/105  Pulse 74  Temp(Src) 98.5 F (36.9 C) (Oral)  Resp 18  Ht 5\' 1"  (1.549 m)  Wt 130 lb (58.968  kg)  BMI 24.58 kg/m2  SpO2 99%  Physical Exam General: Well-developed, well-nourished female in no acute distress; appearance consistent with age of record HENT: normocephalic; atraumatic Eyes: pupils equal, round and reactive to light; extraocular muscles intact; photophobia Neck: supple Heart: regular rate and rhythm Lungs: Expiratory wheezes; rattly cough Abdomen: soft; nondistended; nontender; no masses or hepatosplenomegaly; bowel sounds present Extremities: No deformity; full range of motion; pulses normal Neurologic: Awake, alert and oriented; motor function intact in all extremities and symmetric; no facial droop Skin: Warm and dry Psychiatric: Tearful    ED Course  Procedures (including critical care time)   MDM  5:18 AM No significant improvement with subcutaneous sumatriptan. We'll try the fluid bolus and medications.  5:51 AM Significant improvement with IV medications. Albuterol and Atrovent neb ordered.      Hanley Seamen, MD 09/30/13  [phone]

## 2013-10-22 ENCOUNTER — Telehealth: Payer: Self-pay | Admitting: *Deleted

## 2013-10-22 NOTE — Telephone Encounter (Signed)
Transferred care to [facility].

## 2013-11-10 ENCOUNTER — Encounter (HOSPITAL_BASED_OUTPATIENT_CLINIC_OR_DEPARTMENT_OTHER): Payer: Self-pay | Admitting: Emergency Medicine

## 2013-11-10 ENCOUNTER — Emergency Department (HOSPITAL_BASED_OUTPATIENT_CLINIC_OR_DEPARTMENT_OTHER)
Admission: EM | Admit: 2013-11-10 | Discharge: 2013-11-10 | Disposition: A | Discharge status: Home / Self Care | Payer: Self-pay | Attending: Emergency Medicine | Admitting: Emergency Medicine

## 2013-11-10 DIAGNOSIS — R6889 Other general symptoms and signs: Secondary | ICD-10-CM

## 2013-11-10 DIAGNOSIS — F17200 Nicotine dependence, unspecified, uncomplicated: Secondary | ICD-10-CM | POA: Insufficient documentation

## 2013-11-10 DIAGNOSIS — Z79899 Other long term (current) drug therapy: Secondary | ICD-10-CM | POA: Insufficient documentation

## 2013-11-10 DIAGNOSIS — B9689 Other specified bacterial agents as the cause of diseases classified elsewhere: Secondary | ICD-10-CM

## 2013-11-10 DIAGNOSIS — R000 Tachycardia, unspecified: Secondary | ICD-10-CM | POA: Insufficient documentation

## 2013-11-10 DIAGNOSIS — Z8679 Personal history of other diseases of the circulatory system: Secondary | ICD-10-CM | POA: Insufficient documentation

## 2013-11-10 DIAGNOSIS — B20 Human immunodeficiency virus [HIV] disease: Secondary | ICD-10-CM | POA: Insufficient documentation

## 2013-11-10 DIAGNOSIS — IMO0002 Reserved for concepts with insufficient information to code with codable children: Secondary | ICD-10-CM | POA: Insufficient documentation

## 2013-11-10 DIAGNOSIS — Z862 Personal history of diseases of the blood and blood-forming organs and certain disorders involving the immune mechanism: Secondary | ICD-10-CM | POA: Insufficient documentation

## 2013-11-10 DIAGNOSIS — Z8659 Personal history of other mental and behavioral disorders: Secondary | ICD-10-CM | POA: Insufficient documentation

## 2013-11-10 DIAGNOSIS — Z8742 Personal history of other diseases of the female genital tract: Secondary | ICD-10-CM | POA: Insufficient documentation

## 2013-11-10 DIAGNOSIS — J45909 Unspecified asthma, uncomplicated: Secondary | ICD-10-CM | POA: Insufficient documentation

## 2013-11-10 DIAGNOSIS — L0889 Other specified local infections of the skin and subcutaneous tissue: Secondary | ICD-10-CM | POA: Insufficient documentation

## 2013-11-10 DIAGNOSIS — J111 Influenza due to unidentified influenza virus with other respiratory manifestations: Secondary | ICD-10-CM | POA: Insufficient documentation

## 2013-11-10 MED ORDER — HYDROCOD POLST-CHLORPHEN POLST 10-8 MG/5ML PO LQCR: 5.0000 mL | Freq: Two times a day (BID) | ORAL | Status: AC | PRN

## 2013-11-10 MED ORDER — ONDANSETRON HCL 4 MG PO TABS: 4.0000 mg | ORAL_TABLET | Freq: Four times a day (QID) | ORAL | Status: AC

## 2013-11-10 MED ORDER — DOXYCYCLINE HYCLATE 100 MG PO CAPS: 100.0000 mg | ORAL_CAPSULE | Freq: Two times a day (BID) | ORAL | Status: AC

## 2013-11-10 NOTE — ED Notes (Signed)
Patient here with cough and ongoing bumps to face and scalp for years. Reports that the cough started last pm, no distress

## 2013-11-15 NOTE — ED Provider Notes (Signed)
CSN: [id number]     Arrival date & time 11/10/13  1352 History   First MD Initiated Contact with Patient 11/10/13 1814     Chief Complaint  Patient presents with  . bumps on face   . Cough   (Consider location/radiation/quality/duration/timing/severity/associated sxs/prior Treatment) HPI Cassandra Rogers is a 38 y.o. female who presents to the ED with cough and congestion for 24 hours.  She also complains of a rash to her face and scalp that has been there for years. She has been evaluated by a dermatologist for the the rash but it gets better but never goes away. She hs been on doxycycline in the past for her skin problems. Her PMH is significant for HIV/AIDS. She is allergic to sulfa drugs.   Past Medical History  Diagnosis Date  . HIV (human immunodeficiency virus infection)   . AIDS   . Fibroid   . Thrombocytopenia   . Irregular menses   . Migraine headache   . Asthma   . Cyst, dermoid, arm   . Cellulitis   . Bronchitis   . Depression    Past Surgical History  Procedure Laterality Date  . Tubal ligation    . Cesarean section  x 2   No family history on file. History  Substance Use Topics  . Smoking status: Current Every Day Smoker -- 0.50 packs/day  . Smokeless tobacco: Never Used  . Alcohol Use: No   OB History   Grav Para Term Preterm Abortions TAB SAB Ect Mult Living                 Review of Systems  Constitutional: Negative for fever and chills.  HENT: Positive for congestion. Negative for sore throat and trouble swallowing.   Respiratory: Positive for cough. Negative for shortness of breath and wheezing.   Cardiovascular: Negative for chest pain.  Gastrointestinal: Negative for nausea and vomiting.  Genitourinary: Negative for dysuria and urgency.  Musculoskeletal: Negative for back pain.  Skin: Positive for rash.  Neurological: Negative for light-headedness and headaches.  Psychiatric/Behavioral: Negative for confusion. The patient is not nervous/anxious.      Allergies  Sulfa antibiotics; Vancomycin; and Vicodin  Home Medications   Current Outpatient Rx  Name  Route  Sig  Dispense  Refill  . albuterol (PROVENTIL HFA;VENTOLIN HFA) 108 (90 BASE) MCG/ACT inhaler   Inhalation   Inhale 2 puffs into the lungs every 6 (six) hours as needed. As needed for shortness of breath   6.7 Inhaler   4   . chlorpheniramine-HYDROcodone (TUSSIONEX PENNKINETIC ER) 10-8 MG/5ML LQCR   Oral   Take 5 mLs by mouth every 12 (twelve) hours as needed for cough.   115 mL   0   . doxycycline (VIBRAMYCIN) 100 MG capsule   Oral   Take 1 capsule (100 mg total) by mouth 2 (two) times daily.   14 capsule   0   . emtricitabine-tenofovir (TRUVADA) 200-300 MG per tablet   Oral   Take 1 tablet by mouth daily.   30 tablet   12   . fluticasone (FLONASE) 50 MCG/ACT nasal spray   Nasal   Place 2 sprays into the nose daily as needed. For allergies         . Multiple Vitamin (MULTIVITAMIN) tablet   Oral   Take 1 tablet by mouth daily.         . ondansetron (ZOFRAN) 4 MG tablet   Oral   Take 1 tablet (4  mg total) by mouth every 6 (six) hours.   12 tablet   0   . raltegravir (ISENTRESS) 400 MG tablet   Oral   Take 1 tablet (400 mg total) by mouth 2 (two) times daily.   60 tablet   12   . sodium chloride (OCEAN) 0.65 % nasal spray   Nasal   Place 1 spray into the nose daily as needed. For allergies          BP 107/80  Pulse 101  Temp(Src) 98.9 F (37.2 C) (Oral)  Resp 18  SpO2 98% Physical Exam  Nursing note and vitals reviewed. Constitutional: She is oriented to person, place, and time. She appears well-developed and well-nourished. No distress.  HENT:  Head: Atraumatic.  Multiple red, papular lesions forehead and face at the auricular areas. Some areas noted in the scalp.  Eyes: Conjunctivae and EOM are normal.  Neck: Normal range of motion. Neck supple.  Cardiovascular: Tachycardia present.   Pulmonary/Chest: Effort normal. She has no  wheezes. She has no rales.  Musculoskeletal: Normal range of motion.  Neurological: She is alert and oriented to person, place, and time. No cranial nerve deficit.  Skin: Skin is warm and dry.  Psychiatric: She has a normal mood and affect. Her behavior is normal.    ED Course  Procedures  MDM  38 y.o. female with infected skin lesions and flu like symptoms.  Discussed with the patient and all questioned fully answered. She will return if any problems arise.    Medication List    STOP taking these medications       citalopram 40 MG tablet  Commonly known as:  CELEXA      TAKE these medications       chlorpheniramine-HYDROcodone 10-8 MG/5ML Lqcr  Commonly known as:  TUSSIONEX PENNKINETIC ER  Take 5 mLs by mouth every 12 (twelve) hours as needed for cough.     doxycycline 100 MG capsule  Commonly known as:  VIBRAMYCIN  Take 1 capsule (100 mg total) by mouth 2 (two) times daily.     ondansetron 4 MG tablet  Commonly known as:  ZOFRAN  Take 1 tablet (4 mg total) by mouth every 6 (six) hours.      ASK your doctor about these medications       albuterol 108 (90 BASE) MCG/ACT inhaler  Commonly known as:  PROVENTIL HFA;VENTOLIN HFA  Inhale 2 puffs into the lungs every 6 (six) hours as needed. As needed for shortness of breath     emtricitabine-tenofovir 200-300 MG per tablet  Commonly known as:  TRUVADA  Take 1 tablet by mouth daily.     fluticasone 50 MCG/ACT nasal spray  Commonly known as:  FLONASE  Place 2 sprays into the nose daily as needed. For allergies     multivitamin tablet  Take 1 tablet by mouth daily.     raltegravir 400 MG tablet  Commonly known as:  ISENTRESS  Take 1 tablet (400 mg total) by mouth 2 (two) times daily.     sodium chloride 0.65 % nasal spray  Commonly known as:  OCEAN  Place 1 spray into the nose daily as needed. For allergies        1. Skin infection, bacterial   2. Flu-like symptoms        Ashley Murrain, NP 11/16/13 1718

## 2013-11-19 NOTE — ED Provider Notes (Signed)
Medical screening examination/treatment/procedure(s) were performed by non-physician practitioner and as supervising physician I was immediately available for consultation/collaboration.  EKG Interpretation   None         Neta Ehlers, MD 11/19/13 1139

## 2014-05-07 ENCOUNTER — Emergency Department (HOSPITAL_BASED_OUTPATIENT_CLINIC_OR_DEPARTMENT_OTHER)
Admission: EM | Admit: 2014-05-07 | Discharge: 2014-05-07 | Disposition: A | Discharge status: Home / Self Care | Payer: Self-pay | Attending: Emergency Medicine | Admitting: Emergency Medicine

## 2014-05-07 ENCOUNTER — Encounter (HOSPITAL_BASED_OUTPATIENT_CLINIC_OR_DEPARTMENT_OTHER): Payer: Self-pay | Admitting: Emergency Medicine

## 2014-05-07 DIAGNOSIS — R51 Headache: Secondary | ICD-10-CM

## 2014-05-07 DIAGNOSIS — R519 Headache, unspecified: Secondary | ICD-10-CM

## 2014-05-07 DIAGNOSIS — F329 Major depressive disorder, single episode, unspecified: Secondary | ICD-10-CM | POA: Insufficient documentation

## 2014-05-07 DIAGNOSIS — Z862 Personal history of diseases of the blood and blood-forming organs and certain disorders involving the immune mechanism: Secondary | ICD-10-CM | POA: Insufficient documentation

## 2014-05-07 DIAGNOSIS — Z792 Long term (current) use of antibiotics: Secondary | ICD-10-CM | POA: Insufficient documentation

## 2014-05-07 DIAGNOSIS — F17200 Nicotine dependence, unspecified, uncomplicated: Secondary | ICD-10-CM | POA: Insufficient documentation

## 2014-05-07 DIAGNOSIS — J45909 Unspecified asthma, uncomplicated: Secondary | ICD-10-CM | POA: Insufficient documentation

## 2014-05-07 DIAGNOSIS — Z85828 Personal history of other malignant neoplasm of skin: Secondary | ICD-10-CM | POA: Insufficient documentation

## 2014-05-07 DIAGNOSIS — Z872 Personal history of diseases of the skin and subcutaneous tissue: Secondary | ICD-10-CM | POA: Insufficient documentation

## 2014-05-07 DIAGNOSIS — Z79899 Other long term (current) drug therapy: Secondary | ICD-10-CM | POA: Insufficient documentation

## 2014-05-07 DIAGNOSIS — G43909 Migraine, unspecified, not intractable, without status migrainosus: Secondary | ICD-10-CM | POA: Insufficient documentation

## 2014-05-07 DIAGNOSIS — F3289 Other specified depressive episodes: Secondary | ICD-10-CM | POA: Insufficient documentation

## 2014-05-07 DIAGNOSIS — B20 Human immunodeficiency virus [HIV] disease: Secondary | ICD-10-CM | POA: Insufficient documentation

## 2014-05-07 DIAGNOSIS — IMO0002 Reserved for concepts with insufficient information to code with codable children: Secondary | ICD-10-CM | POA: Insufficient documentation

## 2014-05-07 DIAGNOSIS — Z8742 Personal history of other diseases of the female genital tract: Secondary | ICD-10-CM | POA: Insufficient documentation

## 2014-05-07 MED ORDER — SODIUM CHLORIDE 0.9 % IV SOLN
1000.0000 mL | INTRAVENOUS | Status: DC
  Administered 2014-05-07: 1000 mL via INTRAVENOUS

## 2014-05-07 MED ORDER — METOCLOPRAMIDE HCL 5 MG/ML IJ SOLN
10.0000 mg | Freq: Once | INTRAMUSCULAR | Status: AC
  Administered 2014-05-07: 10 mg via INTRAVENOUS

## 2014-05-07 MED ORDER — SODIUM CHLORIDE 0.9 % IV SOLN
1000.0000 mL | Freq: Once | INTRAVENOUS | Status: AC
  Administered 2014-05-07: 1000 mL via INTRAVENOUS

## 2014-05-07 MED ORDER — DIPHENHYDRAMINE HCL 50 MG/ML IJ SOLN
25.0000 mg | Freq: Once | INTRAMUSCULAR | Status: AC
  Administered 2014-05-07: 25 mg via INTRAVENOUS

## 2014-05-07 MED ORDER — KETOROLAC TROMETHAMINE 30 MG/ML IJ SOLN
30.0000 mg | Freq: Once | INTRAMUSCULAR | Status: AC
  Administered 2014-05-07: 30 mg via INTRAVENOUS

## 2014-05-07 NOTE — Discharge Instructions (Signed)
As discussed, your evaluation today has been largely reassuring.  But, it is important that you monitor your condition carefully, and do not hesitate to return to the ED if you develop new, or concerning changes in your condition. ? ?Otherwise, please follow-up with your physician for appropriate ongoing care. ? ?

## 2014-05-07 NOTE — ED Notes (Signed)
Migraine x 3 days.

## 2014-05-07 NOTE — ED Provider Notes (Signed)
CSN: [id number]     Arrival date & time 05/07/14  1750 History  This chart was scribed for Carmin Muskrat, MD by Irene Pap, ED Scribe. This patient was seen in room MH02/MH02 and patient care was started at 7:01 PM.   Chief Complaint  Patient presents with  . Migraine   The history is provided by the patient. No language interpreter was used.    HPI Comments: Cassandra Rogers is a 38 y.o. female with a history of migraines and HIV who presents to the Emergency Department complaining of a constant, waxing and waning, diffuse headache onset four days ago. She reports that four days ago she was outside with her kids where she got hot and started to experience visual disturbances with associated nausea. Shortly after, she developed a headache so she took Imitrex which improved her headache mildly, but she continued to have a dull, aching headache the next day. She reports that 2 days ago, her headache began progressively worsening. Headache is worse with light and sound. Patient states that headache is similar to her past episodes. Patient denies vomiting, weakness and numbness. Patient states that her T-Cell counts were about 450 at her last ID appointment. She reports allergy to sulfa and Vancomycin.   Past Medical History  Diagnosis Date  . HIV (human immunodeficiency virus infection)   . AIDS   . Fibroid   . Thrombocytopenia   . Irregular menses   . Migraine headache   . Asthma   . Cyst, dermoid, arm   . Cellulitis   . Bronchitis   . Depression    Past Surgical History  Procedure Laterality Date  . Tubal ligation    . Cesarean section  x 2   No family history on file. History  Substance Use Topics  . Smoking status: Current Every Day Smoker -- 0.50 packs/day  . Smokeless tobacco: Never Used  . Alcohol Use: No   OB History   Grav Para Term Preterm Abortions TAB SAB Ect Mult Living                 Review of Systems  Constitutional:       Per HPI, otherwise negative   HENT:       Per HPI, otherwise negative  Respiratory:       Per HPI, otherwise negative  Cardiovascular:       Per HPI, otherwise negative  Gastrointestinal: Negative for vomiting.  Endocrine:       Negative aside from HPI  Genitourinary:       Neg aside from HPI   Musculoskeletal:       Per HPI, otherwise negative  Skin: Negative.   Neurological: Negative for syncope.      Allergies  Sulfa antibiotics; Vancomycin; and Vicodin  Home Medications   Prior to Admission medications   Medication Sig Start Date End Date Taking? Authorizing Provider  Citalopram Hydrobromide (CELEXA PO) Take by mouth.   Yes Historical Provider, MD  ClonazePAM (KLONOPIN PO) Take by mouth.   Yes Historical Provider, MD  SUMAtriptan Succinate (IMITREX PO) Take by mouth.   Yes Historical Provider, MD  albuterol (PROVENTIL HFA;VENTOLIN HFA) 108 (90 BASE) MCG/ACT inhaler Inhale 2 puffs into the lungs every 6 (six) hours as needed. As needed for shortness of breath 12/01/11   Truman Hayward, MD  chlorpheniramine-HYDROcodone [facility] ER) 10-8 MG/5ML LQCR Take 5 mLs by mouth every 12 (twelve) hours as needed for cough. 11/10/13   Hope M  Janit Bern, NP  doxycycline (VIBRAMYCIN) 100 MG capsule Take 1 capsule (100 mg total) by mouth 2 (two) times daily. 11/10/13   Hope Bunnie Pion, NP  emtricitabine-tenofovir (TRUVADA) 200-300 MG per tablet Take 1 tablet by mouth daily. 09/18/12   Campbell Riches, MD  fluticasone (FLONASE) 50 MCG/ACT nasal spray Place 2 sprays into the nose daily as needed. For allergies    Historical Provider, MD  Multiple Vitamin (MULTIVITAMIN) tablet Take 1 tablet by mouth daily.    Historical Provider, MD  ondansetron (ZOFRAN) 4 MG tablet Take 1 tablet (4 mg total) by mouth every 6 (six) hours. 11/10/13   Hope Bunnie Pion, NP  raltegravir (ISENTRESS) 400 MG tablet Take 1 tablet (400 mg total) by mouth 2 (two) times daily. 09/18/12   Campbell Riches, MD  sodium chloride (OCEAN) 0.65 %  nasal spray Place 1 spray into the nose daily as needed. For allergies    Historical Provider, MD   BP 115/76  Pulse 63  Temp(Src) 98.4 F (36.9 C) (Oral)  Resp 20  Ht 5\' 1"  (1.549 m)  Wt 127 lb (57.607 kg)  BMI 24.01 kg/m2  SpO2 98%  LMP 05/05/2014 Physical Exam  Nursing note and vitals reviewed. Constitutional: She is oriented to person, place, and time. She appears well-developed and well-nourished. No distress.  HENT:  Head: Normocephalic and atraumatic.  Eyes: Conjunctivae and EOM are normal.  Cardiovascular: Normal rate, regular rhythm and normal heart sounds.   Pulmonary/Chest: Effort normal and breath sounds normal. No stridor. No respiratory distress.  Abdominal: She exhibits no distension.  Musculoskeletal: She exhibits no edema.  Neurological: She is alert and oriented to person, place, and time. No cranial nerve deficit.  Negative pronator drift. Negative SLR.   Skin: Skin is warm and dry.  Psychiatric: She has a normal mood and affect.    ED Course  Procedures (including critical care time)  COORDINATION OF CARE: 7:06 PM-Discussed treatment plan which includes IV fluids with pt at bedside and pt agreed to plan.   8:28 PM Pain resolved MDM    I personally performed the services described in this documentation, which was scribed in my presence. The recorded information has been reviewed and is accurate.   Patient with a history of headaches presents with typical migraine headache, no new neurologic complaints or deficits on exam.  Patient's pain resolved here, she remained hemodynamically stable, and she was discharged in stable condition.   Carmin Muskrat, MD 05/07/14 2029
